# Patient Record
Sex: Female | Born: 1935 | Race: White | Hispanic: No | Smoking: Never smoker
Health system: Southern US, Community
[De-identification: ages and names within clinical notes are randomized; demographics above are authoritative.]

## PROBLEM LIST (undated history)

## (undated) DIAGNOSIS — G20A1 Parkinson's disease without dyskinesia, without mention of fluctuations: Secondary | ICD-10-CM

## (undated) DIAGNOSIS — R251 Tremor, unspecified: Secondary | ICD-10-CM

## (undated) DIAGNOSIS — M8580 Other specified disorders of bone density and structure, unspecified site: Secondary | ICD-10-CM

## (undated) DIAGNOSIS — I10 Essential (primary) hypertension: Secondary | ICD-10-CM

## (undated) DIAGNOSIS — G20 Parkinson's disease: Secondary | ICD-10-CM

## (undated) DIAGNOSIS — R413 Other amnesia: Secondary | ICD-10-CM

## (undated) DIAGNOSIS — K219 Gastro-esophageal reflux disease without esophagitis: Secondary | ICD-10-CM

## (undated) HISTORY — DX: Tremor, unspecified: R25.1

## (undated) HISTORY — PX: EYE SURGERY: SHX253

## (undated) HISTORY — DX: Other amnesia: R41.3

## (undated) HISTORY — DX: Other specified disorders of bone density and structure, unspecified site: M85.80

## (undated) HISTORY — DX: Gastro-esophageal reflux disease without esophagitis: K21.9

## (undated) HISTORY — DX: Essential (primary) hypertension: I10

## (undated) HISTORY — PX: TONSILLECTOMY: SUR1361

## (undated) HISTORY — PX: BREAST SURGERY: SHX581

---

## 1998-04-21 ENCOUNTER — Other Ambulatory Visit: Admission: RE | Admit: 1998-04-21 | Discharge: 1998-04-21 | Payer: Self-pay | Admitting: *Deleted

## 1999-07-25 ENCOUNTER — Encounter (INDEPENDENT_AMBULATORY_CARE_PROVIDER_SITE_OTHER): Payer: Self-pay | Admitting: *Deleted

## 1999-07-25 ENCOUNTER — Ambulatory Visit (HOSPITAL_COMMUNITY): Admission: RE | Admit: 1999-07-25 | Discharge: 1999-07-25 | Payer: Self-pay | Admitting: Gastroenterology

## 2000-03-13 ENCOUNTER — Encounter: Payer: Self-pay | Admitting: Ophthalmology

## 2000-03-13 ENCOUNTER — Inpatient Hospital Stay (HOSPITAL_COMMUNITY): Admission: EM | Admit: 2000-03-13 | Discharge: 2000-03-14 | Payer: Self-pay | Admitting: Emergency Medicine

## 2000-05-06 ENCOUNTER — Encounter: Payer: Self-pay | Admitting: *Deleted

## 2000-05-06 ENCOUNTER — Encounter: Admission: RE | Admit: 2000-05-06 | Discharge: 2000-05-06 | Payer: Self-pay | Admitting: *Deleted

## 2000-06-07 ENCOUNTER — Encounter: Payer: Self-pay | Admitting: *Deleted

## 2000-06-07 ENCOUNTER — Encounter: Admission: RE | Admit: 2000-06-07 | Discharge: 2000-06-07 | Payer: Self-pay | Admitting: *Deleted

## 2001-06-09 ENCOUNTER — Encounter: Payer: Self-pay | Admitting: Internal Medicine

## 2001-06-09 ENCOUNTER — Encounter: Admission: RE | Admit: 2001-06-09 | Discharge: 2001-06-09 | Payer: Self-pay | Admitting: Internal Medicine

## 2002-06-18 ENCOUNTER — Encounter: Admission: RE | Admit: 2002-06-18 | Discharge: 2002-06-18 | Payer: Self-pay | Admitting: Internal Medicine

## 2002-06-18 ENCOUNTER — Encounter: Payer: Self-pay | Admitting: Internal Medicine

## 2004-02-18 ENCOUNTER — Encounter: Admission: RE | Admit: 2004-02-18 | Discharge: 2004-02-18 | Payer: Self-pay | Admitting: Internal Medicine

## 2004-04-10 ENCOUNTER — Ambulatory Visit (HOSPITAL_COMMUNITY): Admission: RE | Admit: 2004-04-10 | Discharge: 2004-04-10 | Payer: Self-pay | Admitting: Gastroenterology

## 2004-04-10 ENCOUNTER — Encounter (INDEPENDENT_AMBULATORY_CARE_PROVIDER_SITE_OTHER): Payer: Self-pay | Admitting: *Deleted

## 2005-03-15 ENCOUNTER — Encounter: Admission: RE | Admit: 2005-03-15 | Discharge: 2005-03-15 | Payer: Self-pay | Admitting: Internal Medicine

## 2005-10-05 ENCOUNTER — Encounter: Admission: RE | Admit: 2005-10-05 | Discharge: 2005-10-05 | Payer: Self-pay | Admitting: Internal Medicine

## 2006-03-15 ENCOUNTER — Encounter: Admission: RE | Admit: 2006-03-15 | Discharge: 2006-03-15 | Payer: Self-pay | Admitting: Internal Medicine

## 2006-04-09 ENCOUNTER — Encounter: Admission: RE | Admit: 2006-04-09 | Discharge: 2006-04-09 | Payer: Self-pay | Admitting: Internal Medicine

## 2007-04-11 ENCOUNTER — Encounter: Admission: RE | Admit: 2007-04-11 | Discharge: 2007-04-11 | Payer: Self-pay | Admitting: Internal Medicine

## 2007-12-03 ENCOUNTER — Ambulatory Visit: Payer: Self-pay | Admitting: Vascular Surgery

## 2008-04-12 ENCOUNTER — Encounter: Admission: RE | Admit: 2008-04-12 | Discharge: 2008-04-12 | Payer: Self-pay | Admitting: Internal Medicine

## 2009-04-25 ENCOUNTER — Encounter: Admission: RE | Admit: 2009-04-25 | Discharge: 2009-04-25 | Payer: Self-pay | Admitting: Internal Medicine

## 2010-05-24 ENCOUNTER — Encounter: Admission: RE | Admit: 2010-05-24 | Discharge: 2010-05-24 | Payer: Self-pay | Admitting: Internal Medicine

## 2011-01-23 NOTE — Consult Note (Signed)
NEW PATIENT CONSULTATION   Karla Phillips, Karla Phillips  DOB:  1922-06-20                                       12/03/2007  ZOXWR#:[id number]   The patient presents today for evaluation of left leg pain.  She is a  very pleasant 77 year old white female with left leg pain.  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  She does not have any significant  swelling.  Does not have any history of deep vein thrombosis.  She does  take Aleve for discomfort, mostly arthritic pain, and elevates her legs  when possible.  She reports an episode approximately 3 weeks ago when  she had total left leg and also lower back discomfort, which made it  difficult for her to walk.  This is better than it was that particular  Sunday, but is not back to its normal baseline.   PAST MEDICAL HISTORY:  Negative for hypertension or diabetes.   SURGICAL HISTORY:  Significant for tonsillectomy and cataracts.  She  does have prior abdominal surgery and she is unclear as to what was  done.   SOCIAL HISTORY:  She is widowed.  She does not smoke, or drink alcohol.   REVIEW OF SYSTEMS:  Positive for chest tightness and pressure, reflux,  urinary frequency, dizziness, joint pain, and nervousness.   She has no known drug allergies.   PHYSICAL EXAM:  Well-developed, well-nourished white female appearing  stated age of 17.  Blood pressure is 128/80, pulse 68, respirations 18.  Her radial pulses are 2+.  She has 2 to 3+ dorsalis pedis pulses  bilaterally.  She does have a few spider vein telangiectasia in both  lower extremities.  She does not have any swelling.  She does have some  tributary varicosities in her posterior popliteal fossa.  She underwent  a screening duplex by me and this revealed no gross reflux in her great  or small saphenous vein on the left with no enlargement.  I discussed  this with the patient.  I feel that she  most likely has low back or  primary left hip issues causing the discomfort that she has experienced  over the past 3 weeks.  She does not have any significant venous  hypertension and I do not feel she has any significant pain associated  with her small varicosities.  She was concerned regarding the issue of  this causing any more serious complication, and I have reassured her  that this should not put her at any increased risk of deep vein  thrombosis or other serious medical complications.  She is reassured  with this discussion and will see [place] on an as needed basis.   Larina Earthly, M.D.  Electronically Signed   TFE/MEDQ  D:  12/03/2007  T:  12/04/2007  Job:  [id number]   cc:   Georgann Housekeeper, MD

## 2011-01-26 NOTE — Discharge Summary (Signed)
[place]  Patient:    Karla Phillips, Karla Phillips                      MRN: [id number] Adm. Date:  03/13/00 Disc. Date: 03/14/00 Attending:  Guadelupe Sabin, M.D.                           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  HOSPITAL COURSE: (See detailed admission history and physical) The patient was evaluated preoperatively and felt to be in satisfactory condition for the proposed surgery under general anesthesia. The patient, therefore, was taken into the operating room where a scleral buckling procedure was performed using solid silicone implants #277 and #240, cryo application and diathermy application and external drainage of subretinal fluid with intervitreal air injection. The patient tolerated the 1 1/2 hour procedure well and was taken to the recovery room and subsequently to the 23-hour observation unit. The patient was seen on the morning following surgery. At that time, the retina appeared to be settling in place and the meridional fold had flattened at the 12 oclock position with good positioning of the intervitreal air bubble. It was felt that the patient could accomplish the same positioning at home and she was, therefore, discharged home to be followed in the office in 24 hours. The patient was given a printed list of discharge instruction on the care and use of the operated eye.  DISCHARGE MEDICATIONS: Tobradex and Cyclomydril ophthalmic solution 1 drop four times daily, thiamine, Maxitrol and Atropine ointment at bedtime. The patient is to use Tylenol as needed for pain.  CONDITION ON DISCHARGE: Improved.  DISCHARGE DIAGNOSIS: Rhegmatogenous retinal detachment left eye, single defect, pseudophakia both eyes. DD:  03/14/00 TD:  03/14/00 Job: [id number]

## 2011-01-26 NOTE — Op Note (Signed)
[place]  Patient:    Karla Phillips, Karla Phillips                      MRN: [id number] Proc. Date: 03/13/00 Attending:  Guadelupe Sabin, M.D. CC:         Marcelyn Bruins. Nile Riggs, M.D.             Lum Babe, M.D.                           Operative Report  PREOPERATIVE DIAGNOSIS: Rhegmatogenous retinal detachment, left eye, single defect.  POSTOPERATIVE DIAGNOSIS: Rhegmatogenous retinal detachment, left eye, single defect.  OPERATION/PROCEDURE:  Scleral buckling procedure, left eye, using solid silicon implants #277 and 240, cryoapplication, diathermy application, external drainage of subretinal fluid, intravitreal air injection.  ANESTHESIA: General.  SURGEON: Guadelupe Sabin, M.D.  ASSISTANT:  Nurse.  OPERATIVE PROCEDURE:  After the patient was prepped and draped, lid traction sutures were placed in the left upper and lower lids. A peritomy was performed adjacent to the limbus 360 degrees. The subconjunctival tissue was cleaned and the rectus muscles isolated with 4-0 silk traction sutures. The superior rectus muscle which was located directly over the retinal tear was then detached from its anatomic insertion and a 4-0 chromic cat gut suture placed in its tendon centrally and then whipped through each lateral portion of the muscle. The 4-0 silk traction suture was then attached to the stump of the insertion of the muscle. The subconjunctival tissue was cleaned, revealing that the sclera was extremely thin with scleral dehiscence at the 10 oclock and 2 oclock position. The sclera at the 12 oclock position was slightly thicker and was felt to be suitable for lamellar scleral dissection. Indirect ophthalmoscopy was performed and the retinal tear felt to be at the 12 oclock position was treated adjacent to the vitreous base was treated with direct cryo application. It was then elected to perform careful lamellar scleral dissection from the 1:30 to  10:30 position, the bed measuring approximately 10 mm in width. The bed was extremely thin. A total of two 4-0 green Mersilene sutures were closed to close the scleral flaps gently over a trimmed #277 solid silicone implant. A #240 solid silicone encircling band was placed about the globe, tied with two sutures at the 5 oclock position. Anchoring sutures of 5-0 white Dacron were placed at the 4:30 and 8:00 oclock position to hold the encircling band in place. After repeat indirect ophthalmoscopy, it was elected to drain fluid in the bed at the 12 oclock position. Incision was made through the inner scleral lamella. The choroid exposed and treated with light diathermy and then perforated with the pin electrode. An abundant amount of clear viscid subretinal fluid drained and continued draining. The scleral flaps were pulled up and the tension of the encircling band was adjusted. Total drainage of three times was performed. Inspection of the fundus revealed flattening of the retina with a meridional fold at the 12 oclock position. The eye was rather hypotonus following the drainage of subretinal fluid and it was elected to inject through the pyrous plana approximately 0.5 cc of sterile filtered air with a 30 gauge needle to fill the vitreous cavity and to act as a tamponade at the retinal break at the 12 oclock position. This was performed without complication. This improved the fundus status, although the meridional fold appeared to be still present, but smaller in  size. It was, therefore, felt that with positioning, a small amount of residual subretinal fluid would hopefully absorb spontaneously. It was, therefore, elected to close. Tenon capsule was pulled forward in the four quadrants and tied as a separate layer with a 6-0 chromic catgut suture. Neosporin ophthalmic solution was irrigated in the subtenon space. The conjunctiva was then pulled forward and closed with a running 6-0 chromic  catgut suture. Garamycin and Celestone were injected in the subtenon space inferiorly. Maxitrol and Atropine ointment were instilled in the conjunctivae cul-de-sac. A light patch and protective shield were applied to the operated eye. Duration of the procedure, and anesthesia administration was 1 1/2 hours. The patient tolerated the procedure well and general left the operating room for the recovery room and subsequently to the 23 hour observation unit. The patient was carefully placed on her back with her head elevated for positioning of the intraocular air bubble. DD:  03/14/00 TD:  03/14/00 Job: [id number]

## 2011-01-26 NOTE — Op Note (Signed)
NAME:  Karla Phillips, Karla Phillips                       ACCOUNT NO.:  [id number]   MEDICAL RECORD NO.:  [id number]                   PATIENT TYPE:  AMB   LOCATION:  ENDO                                 FACILITY:  [facility]   PHYSICIAN:  Danise Edge, M.D.                DATE OF BIRTH:  June 10, 1922   DATE OF PROCEDURE:  04/10/2004  DATE OF DISCHARGE:                                 OPERATIVE REPORT   REFERRING PHYSICIAN:  Georgann Housekeeper, M.D.   PROCEDURAL INDICATIONS:  Karla Phillips is a 77 year old female born  [birth year].  Karla Phillips is scheduled to undergo her first screening  colonoscopy with polypectomy to prevent colon cancer.   ENDOSCOPIST:  Danise Edge, M.D.   PREMEDICATION:  Versed 5 mg, Demerol 50 mg.   PROCEDURE:  After obtaining informed consent, Karla Phillips was placed in the  left lateral decubitus position.  I administered intravenous Demerol and  intravenous Versed to achieve conscious sedation for the procedure.  The  patient's blood pressure, oxygen saturation, and cardiac rhythm were  monitored throughout the procedure and documented in the medical record.   Anal inspection and digital rectal exam was normal.  The Olympus adjustable  pediatric colonoscope was introduced into the rectum and advanced to the  cecum.  Colonic preparation for the exam today was excellent.   RECTUM:  Normal.   SIGMOID COLON/DESCENDING COLON:  At approximately 50 cm from the anal verge,  a 1 mm sessile polyp was removed with electrocautery snare.   SPLENIC FLEXURE:  Normal.   TRANSVERSE COLON:  Normal.   HEPATIC FLEXURE:  Normal.   ASCENDING COLON:  From the mid ascending colon, a 2 mm sessile polyp was  removed with an electrocautery snare.   CECUM AND ILEOCECAL VALVE:  Normal.   ASSESSMENT:  A small polyp was removed from the mid ascending colon and a  diminutive polyp was removed from the proximal sigmoid colon.  Both polyps  were submitted in one bottle for pathological  evaluation.                                               Danise Edge, M.D.    MJ/MEDQ  D:  04/10/2004  T:  04/10/2004  Job:  [id number]   cc:   Georgann Housekeeper, M.D.  [address]  [place]  [place] [address]  Fax: [phone]

## 2012-03-26 ENCOUNTER — Other Ambulatory Visit: Payer: Self-pay | Admitting: Internal Medicine

## 2012-03-31 ENCOUNTER — Ambulatory Visit
Admission: RE | Admit: 2012-03-31 | Discharge: 2012-03-31 | Disposition: A | Discharge status: Home / Self Care | Payer: Medicare Other | Source: Ambulatory Visit | Attending: Internal Medicine | Admitting: Internal Medicine

## 2012-12-10 ENCOUNTER — Other Ambulatory Visit: Payer: Self-pay | Admitting: Diagnostic Neuroimaging

## 2013-04-06 ENCOUNTER — Other Ambulatory Visit: Payer: Self-pay

## 2013-04-06 MED ORDER — CARBIDOPA-LEVODOPA 25-100 MG PO TABS: 1.0000 | ORAL_TABLET | Freq: Three times a day (TID) | ORAL | Status: DC

## 2013-04-06 NOTE — Telephone Encounter (Signed)
Per Pharmacy, patient requests 90 day Rx

## 2013-04-20 ENCOUNTER — Encounter: Payer: Self-pay | Admitting: Diagnostic Neuroimaging

## 2013-04-20 ENCOUNTER — Ambulatory Visit (INDEPENDENT_AMBULATORY_CARE_PROVIDER_SITE_OTHER): Payer: Medicare Other | Admitting: Diagnostic Neuroimaging

## 2013-04-20 VITALS — BP 134/82 | HR 79 | Ht 61.0 in | Wt 121.0 lb

## 2013-04-20 DIAGNOSIS — G20 Parkinson's disease: Secondary | ICD-10-CM

## 2013-04-20 DIAGNOSIS — G20A1 Parkinson's disease without dyskinesia, without mention of fluctuations: Secondary | ICD-10-CM

## 2013-04-20 NOTE — Progress Notes (Signed)
[facility]  PATIENT: Karla Phillips DOB: November 22, 1921  HISTORY FROM: patient, son REASON FOR VISIT: follow up   HISTORICAL  CHIEF COMPLAINT:  Chief Complaint  Patient presents with  . Follow-up    yearly Per patient    HISTORY OF PRESENT ILLNESS:   UPDATE 04/20/13: Patient was seen in Feb 2014, comes in today on referral by Dr. Eula Listen.  Patient states she is having dizziness, feels like she is going to fall over, like "my head is full of air."   Taking Sinemet 3 times a day before meals, 8am, 12-1pm, and 8-9pm.  Soft, tremulous voice. Son helps with medication administration.  Using a 4 point cane, has a rolling walker at home, but doesn't use much.  UPDATE 10/14/12: No new events. Not having a good day (decr sleep last night). Tremor and memory are stable. Son or grandson stay with her at night.   UPDATE 06/13/12:  Her and her son feel that she has improved with her tremors.  Tolerating carbidopa-levodopa well without worsening dizziness or nause.  Her voice is soft.  Denies any falls.  She uses a 4 pronged cane.  she lives alone however her son lives behind her.  She is in the process of getting a life alert.    UPDATE 04/14/12: More tremor, poor handwriting, hoarse, quiet voice. No falls. Memory stable. Off donepezil.   UPDATE 01/25/11: Doing about the same re: tremor and memory complaints.  On donepezil without side effects. Does c/o "dizziness" intermittently and feeling "shaky" at times. Dizziness is more "lightheadedness" and feeling like she may pass out rather than vertigo. Pt has hx of anxiety. Gave up driving 2 months ago. No new complaints.  DATABASE:  Followed since October 2009 for tremor in her hands which bothers her writing, and gives her some difficulty holding a fork or spoon although she is able to feed herself. Her father and sister had a tremor in older age as well. Also short-term memory problems. For example, she'll forget conversations or things  she was just told. However, she continues to live alone, with her son in a trailer immediately behind her. She pays her own bills, manages her own finances, and looks after her own medications. She continues to drive to familiar places, and is not getting lost. She also does most of her own cooking. She says her moods are okay, and she denies symptoms of depression. She denies changes in appetite. She describes her sleep as "fair", does report naps during the day. Initial MMSE 26, AFT 8. Workup showed borderline B12, nl TSH, mild atrophy on MRI. Rx'd Ativan prn for tremor.  REVIEW OF SYSTEMS: Full 14 system review of systems performed and notable only for Trouble swallowing constipation joint pain weakness dizziness restless leg sleepiness.  ALLERGIES: Not on File  HOME MEDICATIONS: Prior to Admission medications   Medication Sig Start Date End Date Taking? Authorizing Provider  Calcium Carbonate-Vitamin D (CALCIUM-VITAMIN D) 500-200 MG-UNIT per tablet Take 1 tablet by mouth daily.   Yes Historical Provider, MD  carbidopa-levodopa (SINEMET IR) 25-100 MG per tablet Take 1 tablet by mouth 3 (three) times daily. 30 minutes before meals 04/06/13  Yes Suanne Marker, MD  hydrochlorothiazide (HYDRODIURIL) 25 MG tablet  04/03/13  Yes Historical Provider, MD  LORazepam (ATIVAN) 0.5 MG tablet Take 0.5 mg by mouth at bedtime as needed for anxiety.   Yes Historical Provider, MD  meclizine (ANTIVERT) 12.5 MG tablet Take 12.5 mg by mouth daily. PRN  Yes Historical Provider, MD  ranitidine (ZANTAC) 150 MG tablet  04/03/13  Yes Historical Provider, MD  vitamin B-12 (CYANOCOBALAMIN) 1000 MCG tablet Take 1,000 mcg by mouth daily.   Yes Historical Provider, MD   Outpatient Prescriptions Prior to Visit  Medication Sig Dispense Refill  . carbidopa-levodopa (SINEMET IR) 25-100 MG per tablet Take 1 tablet by mouth 3 (three) times daily. 30 minutes before meals  270 tablet  2   No facility-administered medications  prior to visit.    PAST MEDICAL HISTORY: Past Medical History  Diagnosis Date  . Hypertension   . Osteopenia   . GERD (gastroesophageal reflux disease)   . Memory loss   . Tremor     PAST SURGICAL HISTORY: Past Surgical History  Procedure Laterality Date  . Eye surgery    . Breast surgery    . Tonsillectomy      FAMILY HISTORY: No family history on file.  SOCIAL HISTORY:  History   Social History  . Marital Status: Widowed    Spouse Name: N/A    Number of Children: N/A  . Years of Education: N/A   Occupational History  . Not on file.   Social History Main Topics  . Smoking status: Never Smoker   . Smokeless tobacco: Not on file  . Alcohol Use: Not on file  . Drug Use: Not on file  . Sexually Active: Not on file   Other Topics Concern  . Not on file   Social History Narrative   She is retired, used to do office work. She has a high school education. She is widowed and lives alone, but her son lives immediately behind her. She denies tobacco, alcohol, illicit drug use.     PHYSICAL EXAM  Filed Vitals:   04/20/13 0952 04/20/13 0953  BP: 126/64 134/82  Pulse: 74 79  Height: 5\' 1"  (1.549 m)   Weight: 121 lb (54.885 kg)     Not recorded    Body mass index is 22.87 kg/(m^2).  GENERAL EXAM: General: Patient is awake, alert and in no acute distress.  Well developed and groomed.  MASKED FACIES. FLAT AFFECT Cardiovascular: No carotid artery bruits.  Heart is regular rate and rhythm with no murmurs. Musculoskeletal: MILD KYPHOSIS  Neurologic Exam  Mental Status: Awake, alert.  Language is fluent and comprehension intact. SOFT VOICE. POSITIVE SNOUT REFLEX. Cranial Nerves: Pupils are equal and reactive to light.  Visual fields are full to confrontation.  Conjugate eye movements are full and symmetric.  Facial sensation and strength are symmetric.  Hearing is intact.  Palate elevated symmetrically and uvula is midline.  Shoulder shrug is symmetric.  Tongue is  midline. Motor: RESTING TREMOR BUE (RUE > LUE). MILD POSTURAL TREMOR, MOD-SEVERE BRADYKINESIA IN BUE AND BLE. COGWHEELING IN BUE. Normal bulk and tone.  BUE/BLE 4/5.  Sensory: Intact and symmetric to light touch. Coordination: NO DYSMETRIA. Gait and Station: STOOPED POSTURE. BARELY ABLE TO STAND AND TAKE STEPS. NOT USING 4 PRONG CANE WELL. ATAXIA WITH TURNING.    DIAGNOSTIC DATA (LABS, IMAGING, TESTING) - I reviewed patient records, labs, notes, testing and imaging myself where available.  No results found for this basename: WBC,  HGB,  HCT,  MCV,  PLT   No results found for this basename: na,  k,  cl,  co2,  glucose,  bun,  creatinine,  calcium,  prot,  albumin,  ast,  alt,  alkphos,  bilitot,  gfrnonaa,  gfraa   No results found for this  basename: CHOL,  HDL,  LDLCALC,  LDLDIRECT,  TRIG,  CHOLHDL   No results found for this basename: HGBA1C   No results found for this basename: VITAMINB12   No results found for this basename: TSH      ASSESSMENT AND PLAN  79 y.o. right handed, female with parkinson's disease and MCI. Still lives alone, but has good family support (son, grandson). High fall risk.  Dx: parkinson's disease + mild cognitive impairment  PLAN: 1. Increase carb/levo 25/100 up to 1 tab fours times per day or 1.5tabs three times per day 2. home health eval for fall safety and PT 3. Would benefit from rollator walker  Return in about 1 year (around 04/20/2014) for with Heide Guile or Cythia Bachtel.    Suanne Marker, MD 04/20/2013, 10:37 AM Certified in Neurology, Neurophysiology and Neuroimaging  [facility] [address], [address] [place], [place] [address] [phone]

## 2013-04-20 NOTE — Patient Instructions (Addendum)
Recommend taking last dose of Sinemet around 5pm instead of 8-9 pm.  Can increase Sinemet dose to: Up to 1 tablet 4 times a day  Or take 1.5 tablets 3 times a day.  Goal is to get better tremor control or better movement while walking.  Follow up in 1 year.

## 2014-01-20 ENCOUNTER — Other Ambulatory Visit: Payer: Self-pay | Admitting: Diagnostic Neuroimaging

## 2014-01-20 NOTE — Telephone Encounter (Signed)
PLAN:  1. Increase carb/levo 25/100 up to 1 tab fours times per day or 1.5tabs three times per day

## 2014-03-12 ENCOUNTER — Encounter (HOSPITAL_COMMUNITY): Payer: Self-pay | Admitting: Emergency Medicine

## 2014-03-12 ENCOUNTER — Emergency Department (HOSPITAL_COMMUNITY): Payer: Medicare Other

## 2014-03-12 ENCOUNTER — Emergency Department (HOSPITAL_COMMUNITY)
Admission: EM | Admit: 2014-03-12 | Discharge: 2014-03-13 | Disposition: A | Discharge status: Home / Self Care | Payer: Medicare Other | Attending: Emergency Medicine | Admitting: Emergency Medicine

## 2014-03-12 DIAGNOSIS — M899 Disorder of bone, unspecified: Secondary | ICD-10-CM | POA: Insufficient documentation

## 2014-03-12 DIAGNOSIS — R5383 Other fatigue: Secondary | ICD-10-CM | POA: Diagnosis not present

## 2014-03-12 DIAGNOSIS — K219 Gastro-esophageal reflux disease without esophagitis: Secondary | ICD-10-CM | POA: Diagnosis not present

## 2014-03-12 DIAGNOSIS — Y929 Unspecified place or not applicable: Secondary | ICD-10-CM | POA: Diagnosis not present

## 2014-03-12 DIAGNOSIS — R413 Other amnesia: Secondary | ICD-10-CM | POA: Diagnosis not present

## 2014-03-12 DIAGNOSIS — G20 Parkinson's disease: Secondary | ICD-10-CM | POA: Diagnosis not present

## 2014-03-12 DIAGNOSIS — R51 Headache: Secondary | ICD-10-CM | POA: Diagnosis not present

## 2014-03-12 DIAGNOSIS — W19XXXA Unspecified fall, initial encounter: Secondary | ICD-10-CM | POA: Insufficient documentation

## 2014-03-12 DIAGNOSIS — Y939 Activity, unspecified: Secondary | ICD-10-CM | POA: Diagnosis not present

## 2014-03-12 DIAGNOSIS — M6281 Muscle weakness (generalized): Secondary | ICD-10-CM | POA: Diagnosis not present

## 2014-03-12 DIAGNOSIS — R259 Unspecified abnormal involuntary movements: Secondary | ICD-10-CM | POA: Insufficient documentation

## 2014-03-12 DIAGNOSIS — S0990XA Unspecified injury of head, initial encounter: Secondary | ICD-10-CM | POA: Diagnosis present

## 2014-03-12 DIAGNOSIS — G20A1 Parkinson's disease without dyskinesia, without mention of fluctuations: Secondary | ICD-10-CM | POA: Insufficient documentation

## 2014-03-12 DIAGNOSIS — I10 Essential (primary) hypertension: Secondary | ICD-10-CM | POA: Insufficient documentation

## 2014-03-12 DIAGNOSIS — R5381 Other malaise: Secondary | ICD-10-CM | POA: Diagnosis not present

## 2014-03-12 DIAGNOSIS — Z79899 Other long term (current) drug therapy: Secondary | ICD-10-CM | POA: Diagnosis not present

## 2014-03-12 DIAGNOSIS — M949 Disorder of cartilage, unspecified: Secondary | ICD-10-CM

## 2014-03-12 HISTORY — DX: Parkinson's disease without dyskinesia, without mention of fluctuations: G20.A1

## 2014-03-12 HISTORY — DX: Parkinson's disease: G20

## 2014-03-12 LAB — BASIC METABOLIC PANEL
Anion gap: 12 (ref 5–15)
BUN: 31 mg/dL — ABNORMAL HIGH (ref 6–23)
CO2: 29 mEq/L (ref 19–32)
Calcium: 10 mg/dL (ref 8.4–10.5)
Chloride: 100 mEq/L (ref 96–112)
Creatinine, Ser: 0.69 mg/dL (ref 0.50–1.10)
GFR calc non Af Amer: 75 mL/min — ABNORMAL LOW (ref >90)
GFR, EST AFRICAN AMERICAN: 87 mL/min — AB (ref >90)
Glucose, Bld: 119 mg/dL — ABNORMAL HIGH (ref 70–99)
Potassium: 3.8 mEq/L (ref 3.7–5.3)
Sodium: 141 mEq/L (ref 137–147)

## 2014-03-12 LAB — CBC
HCT: 37.5 % (ref 36.0–46.0)
Hemoglobin: 12 g/dL (ref 12.0–15.0)
MCH: 30.5 pg (ref 26.0–34.0)
MCHC: 32 g/dL (ref 30.0–36.0)
MCV: 95.4 fL (ref 78.0–100.0)
Platelets: 165 10*3/uL (ref 150–400)
RBC: 3.93 MIL/uL (ref 3.87–5.11)
RDW: 12.7 % (ref 11.5–15.5)
WBC: 6.5 10*3/uL (ref 4.0–10.5)

## 2014-03-12 LAB — PRO B NATRIURETIC PEPTIDE: PRO B NATRI PEPTIDE: 197.7 pg/mL (ref 0–450)

## 2014-03-12 MED ORDER — SODIUM CHLORIDE 0.9 % IV BOLUS (SEPSIS)
500.0000 mL | Freq: Once | INTRAVENOUS | Status: AC
  Administered 2014-03-13: 500 mL via INTRAVENOUS

## 2014-03-12 MED ORDER — SODIUM CHLORIDE 0.9 % IV BOLUS (SEPSIS)
500.0000 mL | Freq: Once | INTRAVENOUS | Status: AC
  Administered 2014-03-12: 500 mL via INTRAVENOUS

## 2014-03-12 NOTE — ED Notes (Signed)
Patient is from home, fell to floor after getting dizzy.  Patient has had vertigo in the past.  Patient does have some left knee pain after the fall and chronic back pain.  No deformity noted to left knee.  No LOC.  Patient did not hit her head.  Patient does have Parkinson's.

## 2014-03-12 NOTE — ED Notes (Signed)
Pt unsure why she is here. States that she got dizzy, but denies any complaint at this time. Per son, he found pt laying on the floor. States he had seen pt appx 5 minutes prior. Son states that pt was AO at baseline but said "I just got dizzy and fell." Pt denies any pain. No obvious injury. Neuro intact. VSS.

## 2014-03-12 NOTE — ED Notes (Signed)
Dr. Glick at bedside.  

## 2014-03-13 LAB — URINALYSIS, ROUTINE W REFLEX MICROSCOPIC
Bilirubin Urine: NEGATIVE
Glucose, UA: NEGATIVE mg/dL
HGB URINE DIPSTICK: NEGATIVE
KETONES UR: 15 mg/dL — AB
NITRITE: NEGATIVE
PH: 6.5 (ref 5.0–8.0)
PROTEIN: NEGATIVE mg/dL
Specific Gravity, Urine: 1.024 (ref 1.005–1.030)
UROBILINOGEN UA: 1 mg/dL (ref 0.0–1.0)

## 2014-03-13 LAB — URINE MICROSCOPIC-ADD ON

## 2014-03-13 NOTE — Progress Notes (Addendum)
ED CM received call from Flow Manager regarding patient that was seen in ED over night needing HH Services. Reviewed record and confirmed orders, and insurance coverage. Contacted patient and family by phone regarding [facility]. Pt and family  [facility], PT,OT, SW and HHA. Discussed the services available and covered by patient's insurance. Family verbalized understanding and appreciation for the assistance. Offered choice, read list of HH agencies over the phone. Selected [facility] as choice. Verified address and phone number with family.  [facility].  Expl[facility] to family that [facility] at the number verified regarding the assessment visit. Verbalized understanding teach back done. Provided patient and family with my contact information if any further questions or concerns should arise. No further ED CM needs identified.

## 2014-03-13 NOTE — ED Provider Notes (Signed)
I saw and evaluated the patient, reviewed the resident's note and I agree with the findings and plan.  I spoke with the patient and the patient's family at length.  Patient's family is agreeable to taking her home and following up closely with her primary care physician.  They're agreeable to receive home health services including home RN, PT, OT, social work, Engineer, productionaide.  I assisted the resident in placing the home health orders in face-to-face documentation necessary for home health services.  The patient will be contacted by social work at home.  The patient and family have also been informed as to contact numbers if they do not hear from our social worker/case management team.  Patient's son understands to return to the ER for new or seen symptoms.  Close PCP followup.    EKG Interpretation   Date/Time:  Friday March 12 2014 20:13:47 EDT Ventricular Rate:  78 PR Interval:  169 QRS Duration: 137 QT Interval:  432 QTC Calculation: 492 R Axis:   -81 Text Interpretation:  Sinus rhythm Multiform ventricular premature  complexes Left bundle branch block Artifact in lead(s) I III aVR aVL When  compared with ECG of 03/13/2000, Left bundle branch block is now Present  Premature ventricular complexes are now Present Confirmed by [facility]  MD,  DAVID ([id number]) on 03/12/2014 8:18:54 PM        Lyanne CoKevin M Keaundre Thelin, MD 03/13/14 0222

## 2014-03-13 NOTE — ED Provider Notes (Signed)
CSN: [id number]     Arrival date & time 03/12/14  2007 History   First MD Initiated Contact with Patient 03/12/14 2115     Chief Complaint  Patient presents with  . Fall  . Fatigue   80 y/o female with past medical history of parkinsons disease that presents after a ground level fall. Son states that he found the patient down but that she was conscious. She states that she fell due to balance but that she is unsure of LOC, currently she does have a headache. She has generalized weakness that has been present for the past day, son states that she is usually more active but that she has been declining lately. Patient deneis fever, cough/congestion, SOB.chest pain or urinary symptoms.   (Consider location/radiation/quality/duration/timing/severity/associated sxs/prior Treatment) Patient is a 80 y.o. female presenting with fall.  Fall This is a recurrent problem. The current episode started today. The problem occurs intermittently. The problem has been unchanged. Associated symptoms include headaches and weakness. Pertinent negatives include no abdominal pain, chest pain, congestion, coughing, nausea or vomiting. The symptoms are aggravated by walking. She has tried nothing for the symptoms. The treatment provided no relief.    Past Medical History  Diagnosis Date  . Hypertension   . Osteopenia   . GERD (gastroesophageal reflux disease)   . Memory loss   . Tremor   . Parkinson disease    Past Surgical History  Procedure Laterality Date  . Eye surgery    . Breast surgery    . Tonsillectomy     No family history on file. History  Substance Use Topics  . Smoking status: Never Smoker   . Smokeless tobacco: Not on file  . Alcohol Use: No   OB History   Grav Para Term Preterm Abortions TAB SAB Ect Mult Living                 Review of Systems  Constitutional: Negative for activity change.  HENT: Negative for congestion.   Respiratory: Negative for cough and shortness of breath.    Cardiovascular: Negative for chest pain and leg swelling.  Gastrointestinal: Negative for nausea, vomiting, abdominal pain, diarrhea, constipation, blood in stool and abdominal distention.  Genitourinary: Negative for dysuria, flank pain and vaginal discharge.  Musculoskeletal: Negative for back pain.  Skin: Negative for color change.  Neurological: Positive for weakness and headaches. Negative for syncope.  Psychiatric/Behavioral: Negative for agitation.      Allergies  Review of patient's allergies indicates no known allergies.  Home Medications   Prior to Admission medications   Medication Sig Start Date End Date Taking? Authorizing Provider  Calcium Carbonate-Vitamin D (CALCIUM-VITAMIN D) 500-200 MG-UNIT per tablet Take 1 tablet by mouth daily.   Yes Historical Provider, MD  carbidopa-levodopa (SINEMET IR) 25-100 MG per tablet Take 0.5 tablets by mouth 3 (three) times daily. 01/20/14  Yes Suanne MarkerVikram R Penumalli, MD  hydrochlorothiazide (HYDRODIURIL) 25 MG tablet Take 25 mg by mouth daily.  04/03/13  Yes Historical Provider, MD  LORazepam (ATIVAN) 0.5 MG tablet Take 0.5 mg by mouth at bedtime as needed for anxiety.   Yes Historical Provider, MD  meclizine (ANTIVERT) 12.5 MG tablet Take 12.5 mg by mouth daily as needed for dizziness. PRN   Yes Historical Provider, MD  ranitidine (ZANTAC) 150 MG tablet Take 150 mg by mouth daily.  04/03/13  Yes Historical Provider, MD  vitamin B-12 (CYANOCOBALAMIN) 1000 MCG tablet Take 1,000 mcg by mouth daily.   Yes Historical Provider,  MD   BP 115/74  Pulse 63  Temp(Src) 97.5 F (36.4 C) (Oral)  Resp 17  SpO2 97% Physical Exam  Nursing note and vitals reviewed. Constitutional: She is oriented to person, place, and time. She appears well-developed.  No visual signs of trauma on the patient, no noted bruising.   HENT:  Head: Normocephalic.  Eyes: Pupils are equal, round, and reactive to light.  Neck: Neck supple.  Cardiovascular: Normal rate.   Exam reveals no gallop and no friction rub.   No murmur heard. Pulmonary/Chest: Effort normal and breath sounds normal. No respiratory distress.  Abdominal: Soft. She exhibits no distension. There is no tenderness. There is no rebound.  Musculoskeletal: She exhibits no edema.  Neurological: She is alert and oriented to person, place, and time. She has normal reflexes. She displays tremor. No cranial nerve deficit or sensory deficit. She exhibits normal muscle tone. She displays no seizure activity. GCS eye subscore is 4. GCS verbal subscore is 5. GCS motor subscore is 6.  Patient has an unsteady gait  Generalized weakness that is not focal.  Cerebellar testing is intact   Skin: Skin is warm.  Psychiatric: She has a normal mood and affect.    ED Course  Procedures (including critical care time) Labs Review Labs Reviewed  BASIC METABOLIC PANEL - Abnormal; Notable for the following:    Glucose, Bld 119 (*)    BUN 31 (*)    GFR calc non Af Amer 75 (*)    GFR calc Af Amer 87 (*)    All other components within normal limits  URINALYSIS, ROUTINE W REFLEX MICROSCOPIC - Abnormal; Notable for the following:    APPearance CLOUDY (*)    Ketones, ur 15 (*)    Leukocytes, UA SMALL (*)    All other components within normal limits  URINE MICROSCOPIC-ADD ON - Abnormal; Notable for the following:    Bacteria, UA FEW (*)    All other components within normal limits  CBC  PRO B NATRIURETIC PEPTIDE    Imaging Review Dg Chest 2 View  03/12/2014   CLINICAL DATA:  Fall, fatigue.  EXAM: CHEST  2 VIEW  COMPARISON:  None.  FINDINGS: Heart is borderline in size. No confluent airspace opacities or effusions. Mild tortuosity and calcifications in the thoracic aorta. No acute bony abnormality or pneumothorax.  IMPRESSION: No active cardiopulmonary disease.   Electronically Signed   By: Charlett Nose M.D.   On: 03/12/2014 22:43   Ct Head Wo Contrast  03/12/2014   CLINICAL DATA:  Fall.  EXAM: CT HEAD WITHOUT  CONTRAST  CT CERVICAL SPINE WITHOUT CONTRAST  TECHNIQUE: Multidetector CT imaging of the head and cervical spine was performed following the standard protocol without intravenous contrast. Multiplanar CT image reconstructions of the cervical spine were also generated.  COMPARISON:  03/15/2006  FINDINGS: CT HEAD FINDINGS  There is atrophy and chronic small vessel disease changes. No acute intracranial abnormality. Specifically, no hemorrhage, hydrocephalus, mass lesion, acute infarction, or significant intracranial injury. No acute calvarial abnormality. Visualized paranasal sinuses and mastoids clear. Orbital soft tissues unremarkable.  CT CERVICAL SPINE FINDINGS  Mild degenerative disc disease, most pronounced at C5-6 and C6-7. Severe diffuse bilateral degenerative facet disease. Slight anterolisthesis of C4 on C5, C5 on C6 and C6 on C7 related to facet disease. Prevertebral soft tissues are normal. Diffuse osteopenia. No fracture. No epidural or paraspinal hematoma.  IMPRESSION: No acute intracranial abnormality.  No acute bony abnormality within the cervical spine.  Electronically Signed   By: Charlett NoseKevin  Dover M.D.   On: 03/12/2014 22:37   Ct Cervical Spine Wo Contrast  03/12/2014   CLINICAL DATA:  Fall.  EXAM: CT HEAD WITHOUT CONTRAST  CT CERVICAL SPINE WITHOUT CONTRAST  TECHNIQUE: Multidetector CT imaging of the head and cervical spine was performed following the standard protocol without intravenous contrast. Multiplanar CT image reconstructions of the cervical spine were also generated.  COMPARISON:  03/15/2006  FINDINGS: CT HEAD FINDINGS  There is atrophy and chronic small vessel disease changes. No acute intracranial abnormality. Specifically, no hemorrhage, hydrocephalus, mass lesion, acute infarction, or significant intracranial injury. No acute calvarial abnormality. Visualized paranasal sinuses and mastoids clear. Orbital soft tissues unremarkable.  CT CERVICAL SPINE FINDINGS  Mild degenerative disc  disease, most pronounced at C5-6 and C6-7. Severe diffuse bilateral degenerative facet disease. Slight anterolisthesis of C4 on C5, C5 on C6 and C6 on C7 related to facet disease. Prevertebral soft tissues are normal. Diffuse osteopenia. No fracture. No epidural or paraspinal hematoma.  IMPRESSION: No acute intracranial abnormality.  No acute bony abnormality within the cervical spine.   Electronically Signed   By: Charlett NoseKevin  Dover M.D.   On: 03/12/2014 22:37     EKG Interpretation   Date/Time:  Friday March 12 2014 20:13:47 EDT Ventricular Rate:  78 PR Interval:  169 QRS Duration: 137 QT Interval:  432 QTC Calculation: 492 R Axis:   -81 Text Interpretation:  Sinus rhythm Multiform ventricular premature  complexes Left bundle branch block Artifact in lead(s) I III aVR aVL When  compared with ECG of 03/13/2000, Left bundle branch block is now Present  Premature ventricular complexes are now Present Confirmed by [facility] LLCGL[facility]  MD,  DAVID ([id number]) on 03/12/2014 8:18:54 PM      MDM   Final diagnoses:  Other fatigue   80 y/o female with past medical history of parkinsons who presents after a ground level fall brought in by EMS. On physical exam the patient has generalized weakness but no signs of trauma.   On physical exam the patient is only able to transfer from bed to chair and is unable to take steps with assistance.   Patient evaluated with CT head, CT C spine and no acute injuries were identified. The patient's weakness was work-up with CBC, BMP, BNP, UA and chest X ray. No acute abnormality was identified to explain the patient's weakness.   The patient remained hemodynamically stable in the department   It was determined that the patient most likely is weak as a result to the declining condition of parkinson. A long discussion was had with family and it was determined that the patient would be discharged home and home health would be consulted to do a home assessment and to determine the best way  to help that family care for the patient at home. A this time the patient was discharged with instructions to the family that they are always welcome to return with any needs.     Clement SayresStaci Renad Jenniges, MD 03/13/14 2326

## 2014-03-13 NOTE — ED Notes (Signed)
Pt awaiting PTAR.  Son at bedside given number to ED and flow mgr office in case they do not hear back from care management regarding home health.

## 2014-03-13 NOTE — ED Provider Notes (Signed)
80 year old female who comes in after a fall today. Son states that she had fallen several weeks ago and again today. She seems to be somewhat weak. There is no obvious injury in the fall. M., she is noted to have masklike facies and bradykinesia. There is no obvious head injury neck is nontender. Lungs are clear and heart has regular rate and rhythm. Extremities show no evidence of trauma but there is significant increased muscle tone with cogwheel rigidity consistent with Parkinson's disease. I am concerned that her falls are mainly a result of advanced Parkinson's disease. She is being evaluated for occult infection. Head CT was unremarkable and urinalysis is pending.  I saw and evaluated the patient, reviewed the resident's note and I agree with the findings and plan.   EKG Interpretation   Date/Time:  Friday March 12 2014 20:13:47 EDT Ventricular Rate:  78 PR Interval:  169 QRS Duration: 137 QT Interval:  432 QTC Calculation: 492 R Axis:   -81 Text Interpretation:  Sinus rhythm Multiform ventricular premature  complexes Left bundle branch block Artifact in lead(s) I III aVR aVL When  compared with ECG of 03/13/2000, Left bundle branch block is now Present  Premature ventricular complexes are now Present Confirmed by [facility]  MD,  Jeylin Woodmansee ([id number]) on 03/12/2014 8:18:54 PM        Dione Boozeavid Daniella Dewberry, MD 03/13/14 [id number]

## 2014-04-08 ENCOUNTER — Ambulatory Visit: Payer: Medicare Other | Admitting: Diagnostic Neuroimaging

## 2014-04-12 ENCOUNTER — Ambulatory Visit (INDEPENDENT_AMBULATORY_CARE_PROVIDER_SITE_OTHER): Payer: Medicare Other | Admitting: Nurse Practitioner

## 2014-04-12 ENCOUNTER — Encounter: Payer: Self-pay | Admitting: Nurse Practitioner

## 2014-04-12 VITALS — BP 114/69 | HR 68 | Ht 60.0 in | Wt 114.0 lb

## 2014-04-12 DIAGNOSIS — G20 Parkinson's disease: Secondary | ICD-10-CM

## 2014-04-12 DIAGNOSIS — G20A1 Parkinson's disease without dyskinesia, without mention of fluctuations: Secondary | ICD-10-CM

## 2014-04-12 MED ORDER — CARBIDOPA-LEVODOPA 25-100 MG PO TABS: 1.0000 | ORAL_TABLET | Freq: Three times a day (TID) | ORAL | Status: AC

## 2014-04-12 NOTE — Progress Notes (Signed)
PATIENT: Karla Phillips DOB: 06-13-1922  REASON FOR VISIT: routine follow up for Parkinson's Disease HISTORY FROM: patient  HISTORY OF PRESENT ILLNESS: UPDATE 04/12/14 (LL): Patient comes in for yearly followup of Parkinson's disease. She had a fall at home approximately one month ago where her son found her on the ground but awake. She was taken to the hospital and get checked out, CT head and cervical spine were negative for anything acute. Son who accompanies her today states that she has been more weak in the last few months. After hospital discharge she was set up with home health physical therapy and occupational therapy and a nurse aide is coming to the house twice a week to help her bathe. Her son is still helping her with medicine administration and staying with her at night. She is no longer using the cane and using rolling walker at all times. She admits to having hallucinations of people and children in the house and once a dog, but she has not shared these hallucinations with family members. She is not upset by the hallucinations. She has continued to take only half a tablet of Sinemet 3 times a day. She has no complaints of nausea, trouble swallowing or constipation.  UPDATE 04/20/13 (VRP): Patient was seen in Feb 2014, comes in today on referral by Dr. Eula Phillips.  Patient states she is having dizziness, feels like she is going to fall over, like "my head is full of air."   Taking Sinemet 3 times a day before meals, 8am, 12-1pm, and 8-9pm.  Soft, tremulous voice. Son helps with medication administration.  Using a 4 point cane, has a rolling walker at home, but doesn't use much.  UPDATE 10/14/12: No new events. Not having a good day (decr sleep last night). Tremor and memory are stable. Son or grandson stay with her at night.   UPDATE 06/13/12:  Her and her son feel that she has improved with her tremors.  Tolerating carbidopa-levodopa well without worsening dizziness or nause.  Her voice is  soft.  Denies any falls.  She uses a 4 pronged cane.  she lives alone however her son lives behind her.  She is in the process of getting a life alert.    UPDATE 04/14/12: More tremor, poor handwriting, hoarse, quiet voice. No falls. Memory stable. Off donepezil.   UPDATE 01/25/11: Doing about the same re: tremor and memory complaints.  On donepezil without side effects. Does c/o "dizziness" intermittently and feeling "shaky" at times. Dizziness is more "lightheadedness" and feeling like she may pass out rather than vertigo. Pt has hx of anxiety. Gave up driving 2 months ago. No new complaints.  DATABASE:  Followed since October 2009 for tremor in her hands which bothers her writing, and gives her some difficulty holding a fork or spoon although she is able to feed herself. Her father and sister had a tremor in older age as well. Also short-term memory problems. For example, she'll forget conversations or things she was just told. However, she continues to live alone, with her son in a trailer immediately behind her. She pays her own bills, manages her own finances, and looks after her own medications. She continues to drive to familiar places, and is not getting lost. She also does most of her own cooking. She says her moods are okay, and she denies symptoms of depression. She denies changes in appetite. She describes her sleep as "fair", does report naps during the day. Initial MMSE 26, AFT  8. Workup showed borderline B12, nl TSH, mild atrophy on MRI. Rx'd Ativan prn for tremor.  REVIEW OF SYSTEMS: Full 14 system review of systems performed and notable only for joint pain, dizziness, confusion, restless leg, daytime sleepiness, joint swelling, back pain, aching muscles, walking difficulty, itching.  ALLERGIES: No Known Allergies  HOME MEDICATIONS: Outpatient Prescriptions Prior to Visit  Medication Sig Dispense Refill  . Calcium Carbonate-Vitamin D (CALCIUM-VITAMIN D) 500-200 MG-UNIT per tablet Take 1  tablet by mouth daily.      . hydrochlorothiazide (HYDRODIURIL) 25 MG tablet Take 25 mg by mouth daily.       Marland Kitchen. LORazepam (ATIVAN) 0.5 MG tablet Take 0.5 mg by mouth at bedtime as needed for anxiety.      . meclizine (ANTIVERT) 12.5 MG tablet Take 12.5 mg by mouth daily as needed for dizziness. PRN      . ranitidine (ZANTAC) 150 MG tablet Take 150 mg by mouth daily.       . vitamin B-12 (CYANOCOBALAMIN) 1000 MCG tablet Take 1,000 mcg by mouth daily.      . carbidopa-levodopa (SINEMET IR) 25-100 MG per tablet Take 0.5 tablets by mouth 3 (three) times daily.       No facility-administered medications prior to visit.    PHYSICAL EXAM Filed Vitals:   04/12/14 1133  BP: 114/69  Pulse: 68  Height: 5' (1.524 m)  Weight: 114 lb (51.71 kg)   Body mass index is 22.26 kg/(m^2). No exam data present No flowsheet data found.  No flowsheet data found.   Generalized: Well developed, in no acute distress  Head: normocephalic and atraumatic. Oropharynx benign  Neck: Supple, no carotid bruits  Cardiac: Regular rate rhythm, no murmur  Musculoskeletal: No deformity  Neurologic Exam  Mental Status: Awake, alert.  Language is fluent and comprehension intact. SOFT VOICE. POSITIVE SNOUT REFLEX. Cranial Nerves: Pupils are equal and reactive to light.  Visual fields are full to confrontation.  Conjugate eye movements are full and symmetric.  Facial sensation and strength are symmetric.  Hearing is intact.  Palate elevated symmetrically and uvula is midline.  Shoulder shrug is symmetric.  Tongue is midline. Motor: RESTING TREMOR BUE (RUE > LUE). MILD POSTURAL TREMOR, MOD-SEVERE BRADYKINESIA IN BUE AND BLE. COGWHEELING IN BUE. Normal bulk and tone.  BUE/BLE 4/5.  Sensory: Intact and symmetric to light touch. Coordination: NO DYSMETRIA. Gait and Station: STOOPED POSTURE. BARELY ABLE TO STAND AND TAKE STEPS.  ATAXIA WITH TURNING.   DIAGNOSTIC DATA (LABS, IMAGING, TESTING) - I reviewed patient records,  labs, notes, testing and imaging myself where available.  Lab Results  Component Value Date   WBC 6.5 03/12/2014   HGB 12.0 03/12/2014   HCT 37.5 03/12/2014   MCV 95.4 03/12/2014   PLT 165 03/12/2014      Component Value Date/Time   NA 141 03/12/2014 2055   K 3.8 03/12/2014 2055   CL 100 03/12/2014 2055   CO2 29 03/12/2014 2055   GLUCOSE 119* 03/12/2014 2055   BUN 31* 03/12/2014 2055   CREATININE 0.69 03/12/2014 2055   CALCIUM 10.0 03/12/2014 2055   GFRNONAA 75* 03/12/2014 2055   GFRAA 87* 03/12/2014 2055   03/13/14 CT CERVICAL SPINE FINDINGS Mild degenerative disc disease, most pronounced at C5-6 and C6-7. Severe diffuse bilateral degenerative facet disease. Slight anterolisthesis of C4 on C5, C5 on C6 and C6 on C7 related to facet disease. Prevertebral soft tissues are normal. Diffuse osteopenia. No fracture. No epidural or paraspinal hematoma. IMPRESSION: No  acute intracranial abnormality. No acute bony abnormality within the cervical spine.   03/13/14  HEAD FINDINGS There is atrophy and chronic small vessel disease changes. No acute intracranial abnormality. Specifically, no hemorrhage, hydrocephalus, mass lesion, acute infarction, or significant intracranial injury. No acute calvarial abnormality. Visualized paranasal sinuses and mastoids clear. Orbital soft tissues unremarkable.   ASSESSMENT: 80 y.o. right handed, female with advanced parkinson's disease and MCI. Still lives alone, but has good family support (son, grandson). High fall risk.  Dx: parkinson's disease + mild cognitive impairment  PLAN: 1. Increase carb/levo 25/100 up to or 1 tab three times a day, 30 minutes before meals. 2. Continue home health therapies, PT, OT 3. Use rollator walker at all times 4. Asked family to reassure her with hallucinations, help her distinguish what is real and what is not. 5. Follow up in 6 months, sooner as needed.  Meds ordered this encounter  Medications  . carbidopa-levodopa (SINEMET IR) 25-100 MG per  tablet    Sig: Take 1 tablet by mouth 3 (three) times daily. Give 30 minutes before meals.    Dispense:  90 tablet    Refill:  3    Order Specific Question:  Supervising Provider    Answer:  Suanne Marker [[id number]]   Return in about 6 months (around 10/13/2014) for Parkinsons Disease.  Tawny Asal Reona Zendejas, MSN, FNP-BC, A/GNP-C 04/12/2014, 12:51 PM [facility] [address] [place], [place] [address] [phone]  Note: This document was prepared with digital dictation and possible smart phrase technology. Any transcriptional errors that result from this process are unintentional.

## 2014-04-12 NOTE — Progress Notes (Signed)
I reviewed note and agree with plan.   VIKRAM R. PENUMALLI, MD 04/12/2014, 1:09 PM Certified in Neurology, Neurophysiology and Neuroimaging  [facility] [address], [address] [place], [place] [address] [phone]  

## 2014-04-12 NOTE — Patient Instructions (Signed)
1. Increase carb/levo 25/100 up to or 1 tab three times a day, 30 minutes before meals.  2. Continue home health therapies, PT, OT  3. Use rollator walker at all times  4. Try to reassure her with hallucinations, help her distinguish what is real and what is not.  4. Follow up in 6 months, sooner as needed.

## 2014-07-28 ENCOUNTER — Encounter: Payer: Self-pay | Admitting: Diagnostic Neuroimaging

## 2014-10-13 ENCOUNTER — Ambulatory Visit (INDEPENDENT_AMBULATORY_CARE_PROVIDER_SITE_OTHER): Payer: Medicare Other | Admitting: Diagnostic Neuroimaging

## 2014-10-13 ENCOUNTER — Encounter: Payer: Self-pay | Admitting: Diagnostic Neuroimaging

## 2014-10-13 ENCOUNTER — Ambulatory Visit: Payer: Medicare Other | Admitting: Nurse Practitioner

## 2014-10-13 VITALS — BP 154/88 | HR 67 | Temp 98.3°F

## 2014-10-13 DIAGNOSIS — G20 Parkinson's disease: Secondary | ICD-10-CM

## 2014-10-13 NOTE — Progress Notes (Signed)
PATIENT: Karla Phillips DOB: 1921/11/19  REASON FOR VISIT: routine follow up for Parkinson's Disease HISTORY FROM: patient  HISTORY OF PRESENT ILLNESS:  UPDATE 10/13/14 (VRP): Since last visit, patient continues to live alone, with sig help from family. She feels generally weak.  UPDATE 04/12/14 (LL): Patient comes in for yearly followup of Parkinson's disease. She had a fall at home approximately one month ago where her son found her on the ground but awake. She was taken to the hospital and get checked out, CT head and cervical spine were negative for anything acute. Son who accompanies her today states that she has been more weak in the last few months. After hospital discharge she was set up with home health physical therapy and occupational therapy and a nurse aide is coming to the house twice a week to help her bathe. Her son is still helping her with medicine administration and staying with her at night. She is no longer using the cane and using rolling walker at all times. She admits to having hallucinations of people and children in the house and once a dog, but she has not shared these hallucinations with family members. She is not upset by the hallucinations. She has continued to take only half a tablet of Sinemet 3 times a day. She has no complaints of nausea, trouble swallowing or constipation.  UPDATE 04/20/13 (VRP): Patient was seen in Feb 2014, comes in today on referral by Dr. Eula ListenHussain.  Patient states she is having dizziness, feels like she is going to fall over, like "my head is full of air."   Taking Sinemet 3 times a day before meals, 8am, 12-1pm, and 8-9pm.  Soft, tremulous voice. Son helps with medication administration.  Using a 4 point cane, has a rolling walker at home, but doesn't use much.  UPDATE 10/14/12: No new events. Not having a good day (decr sleep last night). Tremor and memory are stable. Son or grandson stay with her at night.   UPDATE 06/13/12:  Her and her son  feel that she has improved with her tremors.  Tolerating carbidopa-levodopa well without worsening dizziness or nause.  Her voice is soft.  Denies any falls.  She uses a 4 pronged cane.  she lives alone however her son lives behind her.  She is in the process of getting a life alert.    UPDATE 04/14/12: More tremor, poor handwriting, hoarse, quiet voice. No falls. Memory stable. Off donepezil.   UPDATE 01/25/11: Doing about the same re: tremor and memory complaints.  On donepezil without side effects. Does c/o "dizziness" intermittently and feeling "shaky" at times. Dizziness is more "lightheadedness" and feeling like she may pass out rather than vertigo. Pt has hx of anxiety. Gave up driving 2 months ago. No new complaints.    DATABASE:  Followed since October 2009 for tremor in her hands which bothers her writing, and gives her some difficulty holding a fork or spoon although she is able to feed herself. Her father and sister had a tremor in older age as well. Also short-term memory problems. For example, she'll forget conversations or things she was just told. However, she continues to live alone, with her son in a trailer immediately behind her. She pays her own bills, manages her own finances, and looks after her own medications. She continues to drive to familiar places, and is not getting lost. She also does most of her own cooking. She says her moods are okay, and she denies  symptoms of depression. She denies changes in appetite. She describes her sleep as "fair", does report naps during the day. Initial MMSE 26, AFT 8. Workup showed borderline B12, nl TSH, mild atrophy on MRI. Rx'd Ativan prn for tremor.   REVIEW OF SYSTEMS: Full 14 system review of systems performed and notable only for memory loss weakness joint pain aching muscles walking difficulty fatigue.   ALLERGIES: No Known Allergies  HOME MEDICATIONS: Outpatient Prescriptions Prior to Visit  Medication Sig Dispense Refill  . Calcium  Carbonate-Vitamin D (CALCIUM-VITAMIN D) 500-200 MG-UNIT per tablet Take 1 tablet by mouth daily.    . carbidopa-levodopa (SINEMET IR) 25-100 MG per tablet Take 1 tablet by mouth 3 (three) times daily. Give 30 minutes before meals. 90 tablet 3  . hydrochlorothiazide (HYDRODIURIL) 25 MG tablet Take 25 mg by mouth daily.     Marland Kitchen LORazepam (ATIVAN) 0.5 MG tablet Take 0.5 mg by mouth at bedtime as needed for anxiety.    . meclizine (ANTIVERT) 12.5 MG tablet Take 12.5 mg by mouth daily as needed for dizziness. PRN    . ranitidine (ZANTAC) 150 MG tablet Take 150 mg by mouth daily.     . vitamin B-12 (CYANOCOBALAMIN) 1000 MCG tablet Take 1,000 mcg by mouth daily.     No facility-administered medications prior to visit.    PHYSICAL EXAM Filed Vitals:   10/13/14 1335  BP: 154/88  Pulse: 67  Temp: 98.3 F (36.8 C)  TempSrc: Oral   Cannot calculate BMI with a height equal to zero.  Visual Acuity Screening   Right eye Left eye Both eyes  Without correction: unable unable   With correction:      No flowsheet data found.  No flowsheet data found.   Generalized: Well developed, in no acute distress; ELDERLY, FRAIL APPEARING Neck: Supple, no carotid bruits  Cardiac: Regular rate rhythm, no murmur    Neurologic Exam  Mental Status: Awake, alert.  Language is fluent and comprehension intact. SOFT VOICE. POSITIVE SNOUT REFLEX. Cranial Nerves: Pupils are equal and reactive to light.  Visual fields are full to confrontation.  Conjugate eye movements are full and symmetric.  Facial sensation and strength are symmetric.  Hearing is intact.  Palate elevated symmetrically and uvula is midline.  Shoulder shrug is symmetric.  Tongue is midline. Motor: RESTING TREMOR BUE (RUE > LUE). MILD POSTURAL TREMOR, MOD-SEVERE BRADYKINESIA IN BUE AND BLE. COGWHEELING IN BUE. Normal bulk and tone.  DIFFUSE WEAKNESS (3-4/5).  Sensory: Intact and symmetric to light touch. Coordination: NO DYSMETRIA. Gait and Station:  IN [place]; CANNOT STAND UNASSITED  DIAGNOSTIC DATA (LABS, IMAGING, TESTING) - I reviewed patient records, labs, notes, testing and imaging myself where available.  Lab Results  Component Value Date   WBC 6.5 03/12/2014   HGB 12.0 03/12/2014   HCT 37.5 03/12/2014   MCV 95.4 03/12/2014   PLT 165 03/12/2014      Component Value Date/Time   NA 141 03/12/2014 2055   K 3.8 03/12/2014 2055   CL 100 03/12/2014 2055   CO2 29 03/12/2014 2055   GLUCOSE 119* 03/12/2014 2055   BUN 31* 03/12/2014 2055   CREATININE 0.69 03/12/2014 2055   CALCIUM 10.0 03/12/2014 2055   GFRNONAA 75* 03/12/2014 2055   GFRAA 87* 03/12/2014 2055   03/13/14 CT CERVICAL SPINE FINDINGS Mild degenerative disc disease, most pronounced at C5-6 and C6-7. Severe diffuse bilateral degenerative facet disease. Slight anterolisthesis of C4 on C5, C5 on C6 and C6 on C7 related  to facet disease. Prevertebral soft tissues are normal. Diffuse osteopenia. No fracture. No epidural or paraspinal hematoma. IMPRESSION: No acute intracranial abnormality. No acute bony abnormality within the cervical spine.   03/13/14  HEAD FINDINGS There is atrophy and chronic small vessel disease changes. No acute intracranial abnormality. Specifically, no hemorrhage, hydrocephalus, mass lesion, acute infarction, or significant intracranial injury. No acute calvarial abnormality. Visualized paranasal sinuses and mastoids clear. Orbital soft tissues unremarkable.    ASSESSMENT: 81 y.o. right handed, female with advanced parkinson's disease and MCI. Still lives alone, but has good family support (son, grandson). High fall risk.  Dx: parkinson's disease + mild cognitive impairment  PLAN: 1. Continue carb/levo 25/100 TID 2. Continue home health therapies, PT, OT 3. Use rollator walker at all times 4. Asked family to reassure her with hallucinations, help her distinguish what is real and what is not. 5. Palliative care consult to help define goals of  care, MOST form, code status  Return in about 1 year (around 10/14/2015), or if symptoms worsen or fail to improve.  Suanne Marker, MD 10/13/2014, 2:45 PM Certified in Neurology, Neurophysiology and Neuroimaging  [facility] [address], [address] [place], [place] [address] [phone]

## 2015-03-06 ENCOUNTER — Emergency Department (HOSPITAL_COMMUNITY): Payer: Medicare Other

## 2015-03-06 ENCOUNTER — Encounter (HOSPITAL_COMMUNITY): Payer: Self-pay

## 2015-03-06 ENCOUNTER — Inpatient Hospital Stay (HOSPITAL_COMMUNITY)
Admission: EM | Admit: 2015-03-06 | Discharge: 2015-03-09 | DRG: 087 | Disposition: A | Discharge status: Skilled Nursing Facility | Payer: Medicare Other | Attending: Internal Medicine | Admitting: Internal Medicine

## 2015-03-06 DIAGNOSIS — R296 Repeated falls: Secondary | ICD-10-CM | POA: Diagnosis present

## 2015-03-06 DIAGNOSIS — Z515 Encounter for palliative care: Secondary | ICD-10-CM

## 2015-03-06 DIAGNOSIS — S0240CA Maxillary fracture, right side, initial encounter for closed fracture: Secondary | ICD-10-CM

## 2015-03-06 DIAGNOSIS — Z79899 Other long term (current) drug therapy: Secondary | ICD-10-CM

## 2015-03-06 DIAGNOSIS — S02402A Zygomatic fracture, unspecified, initial encounter for closed fracture: Secondary | ICD-10-CM | POA: Diagnosis present

## 2015-03-06 DIAGNOSIS — G20 Parkinson's disease: Secondary | ICD-10-CM | POA: Diagnosis present

## 2015-03-06 DIAGNOSIS — W19XXXA Unspecified fall, initial encounter: Secondary | ICD-10-CM

## 2015-03-06 DIAGNOSIS — Y92000 Kitchen of unspecified non-institutional (private) residence as  the place of occurrence of the external cause: Secondary | ICD-10-CM

## 2015-03-06 DIAGNOSIS — S02401A Maxillary fracture, unspecified, initial encounter for closed fracture: Secondary | ICD-10-CM | POA: Diagnosis present

## 2015-03-06 DIAGNOSIS — K219 Gastro-esophageal reflux disease without esophagitis: Secondary | ICD-10-CM | POA: Diagnosis present

## 2015-03-06 DIAGNOSIS — S06360A Traumatic hemorrhage of cerebrum, unspecified, without loss of consciousness, initial encounter: Secondary | ICD-10-CM | POA: Diagnosis not present

## 2015-03-06 DIAGNOSIS — S0630AA Unspecified focal traumatic brain injury with loss of consciousness status unknown, initial encounter: Secondary | ICD-10-CM | POA: Diagnosis present

## 2015-03-06 DIAGNOSIS — R627 Adult failure to thrive: Secondary | ICD-10-CM | POA: Diagnosis present

## 2015-03-06 DIAGNOSIS — M8580 Other specified disorders of bone density and structure, unspecified site: Secondary | ICD-10-CM | POA: Diagnosis present

## 2015-03-06 DIAGNOSIS — S06309A Unspecified focal traumatic brain injury with loss of consciousness of unspecified duration, initial encounter: Secondary | ICD-10-CM | POA: Diagnosis present

## 2015-03-06 DIAGNOSIS — E860 Dehydration: Secondary | ICD-10-CM | POA: Diagnosis present

## 2015-03-06 DIAGNOSIS — R1310 Dysphagia, unspecified: Secondary | ICD-10-CM | POA: Diagnosis present

## 2015-03-06 DIAGNOSIS — S0990XA Unspecified injury of head, initial encounter: Secondary | ICD-10-CM | POA: Diagnosis not present

## 2015-03-06 DIAGNOSIS — S0292XA Unspecified fracture of facial bones, initial encounter for closed fracture: Secondary | ICD-10-CM | POA: Diagnosis present

## 2015-03-06 DIAGNOSIS — W1830XA Fall on same level, unspecified, initial encounter: Secondary | ICD-10-CM | POA: Diagnosis present

## 2015-03-06 DIAGNOSIS — Z66 Do not resuscitate: Secondary | ICD-10-CM | POA: Diagnosis present

## 2015-03-06 DIAGNOSIS — I10 Essential (primary) hypertension: Secondary | ICD-10-CM | POA: Diagnosis present

## 2015-03-06 DIAGNOSIS — I615 Nontraumatic intracerebral hemorrhage, intraventricular: Secondary | ICD-10-CM

## 2015-03-06 DIAGNOSIS — R413 Other amnesia: Secondary | ICD-10-CM | POA: Diagnosis present

## 2015-03-06 LAB — CBC
HEMATOCRIT: 38.7 % (ref 36.0–46.0)
HEMOGLOBIN: 12.4 g/dL (ref 12.0–15.0)
MCH: 30 pg (ref 26.0–34.0)
MCHC: 32 g/dL (ref 30.0–36.0)
MCV: 93.5 fL (ref 78.0–100.0)
Platelets: 179 10*3/uL (ref 150–400)
RBC: 4.14 MIL/uL (ref 3.87–5.11)
RDW: 12.9 % (ref 11.5–15.5)
WBC: 6.9 10*3/uL (ref 4.0–10.5)

## 2015-03-06 LAB — URINE MICROSCOPIC-ADD ON

## 2015-03-06 LAB — URINALYSIS, ROUTINE W REFLEX MICROSCOPIC
Glucose, UA: NEGATIVE mg/dL
HGB URINE DIPSTICK: NEGATIVE
KETONES UR: 15 mg/dL — AB
Leukocytes, UA: NEGATIVE
NITRITE: NEGATIVE
Protein, ur: 30 mg/dL — AB
SPECIFIC GRAVITY, URINE: 1.029 (ref 1.005–1.030)
UROBILINOGEN UA: 1 mg/dL (ref 0.0–1.0)
pH: 5.5 (ref 5.0–8.0)

## 2015-03-06 LAB — BASIC METABOLIC PANEL
ANION GAP: 9 (ref 5–15)
BUN: 26 mg/dL — ABNORMAL HIGH (ref 6–20)
CALCIUM: 9.6 mg/dL (ref 8.9–10.3)
CHLORIDE: 103 mmol/L (ref 101–111)
CO2: 29 mmol/L (ref 22–32)
CREATININE: 0.75 mg/dL (ref 0.44–1.00)
GFR calc Af Amer: >60 mL/min (ref >60)
GFR calc non Af Amer: >60 mL/min (ref >60)
Glucose, Bld: 146 mg/dL — ABNORMAL HIGH (ref 65–99)
Potassium: 3.5 mmol/L (ref 3.5–5.1)
Sodium: 141 mmol/L (ref 135–145)

## 2015-03-06 LAB — I-STAT TROPONIN, ED: Troponin i, poc: 0.02 ng/mL (ref 0.00–0.08)

## 2015-03-06 MED ORDER — CEPHALEXIN 250 MG PO CAPS: 500.0000 mg | ORAL_CAPSULE | Freq: Once | ORAL | Status: DC

## 2015-03-06 MED ORDER — SODIUM CHLORIDE 0.9 % IV BOLUS (SEPSIS)
1000.0000 mL | Freq: Once | INTRAVENOUS | Status: AC
Start: 2015-03-06 — End: 2015-03-06
  Administered 2015-03-06: 1000 mL via INTRAVENOUS

## 2015-03-06 NOTE — ED Notes (Signed)
Pt arrived via ems c/o fall at home found on the floor.  Pt had unwitnessed fall at home, live-in son reports was gone about an hour.  Pt has blood on right side of head, abrasion to right eye, right eye swelling, blood from nose and mouth. Pt has yellow/green bruising to left arm, left side of head, right knee, scabbed abrasion to left elbow and left knee.

## 2015-03-06 NOTE — ED Provider Notes (Signed)
CSN: [id number]     Arrival date & time 03/06/15  2000 History   First MD Initiated Contact with Patient 03/06/15 2007     Chief Complaint  Patient presents with  . Fall     (Consider location/radiation/quality/duration/timing/severity/associated sxs/prior Treatment) HPI Comments: Found down at home. Lives with son. He states he left for one hour and he found her down.   Patient is a 81 y.o. female presenting with fall. The history is provided by the patient.  Fall This is a new problem. The current episode started 1 to 2 hours ago. The problem occurs daily. The problem has not changed since onset.Associated symptoms include headaches. Pertinent negatives include no chest pain and no abdominal pain. Nothing aggravates the symptoms. Nothing relieves the symptoms.    Past Medical History  Diagnosis Date  . Hypertension   . Osteopenia   . GERD (gastroesophageal reflux disease)   . Memory loss   . Tremor   . Parkinson disease    Past Surgical History  Procedure Laterality Date  . Eye surgery    . Breast surgery    . Tonsillectomy     History reviewed. No pertinent family history. History  Substance Use Topics  . Smoking status: Never Smoker   . Smokeless tobacco: Not on file  . Alcohol Use: No   OB History    No data available     Review of Systems  Constitutional: Negative for fever and chills.  Cardiovascular: Negative for chest pain.  Gastrointestinal: Negative for abdominal pain.  Neurological: Positive for headaches.  All other systems reviewed and are negative.     Allergies  Review of patient's allergies indicates no known allergies.  Home Medications   Prior to Admission medications   Medication Sig Start Date End Date Taking? Authorizing Provider  carbidopa-levodopa (SINEMET IR) 25-100 MG per tablet Take 1 tablet by mouth 3 (three) times daily. Give 30 minutes before meals. 04/12/14  Yes Ronal Fear, NP  donepezil (ARICEPT) 10 MG tablet Take 10 mg by  mouth at bedtime.   Yes Historical Provider, MD  hydrochlorothiazide (HYDRODIURIL) 25 MG tablet Take 25 mg by mouth daily.  04/03/13  Yes Historical Provider, MD  LORazepam (ATIVAN) 0.5 MG tablet Take 0.5 mg by mouth daily.    Yes Historical Provider, MD  meclizine (ANTIVERT) 12.5 MG tablet Take 12.5 mg by mouth daily as needed for dizziness. PRN   Yes Historical Provider, MD  ranitidine (ZANTAC) 150 MG tablet Take 150 mg by mouth 2 (two) times daily.  04/03/13  Yes Historical Provider, MD  Calcium Carbonate-Vitamin D (CALCIUM-VITAMIN D) 500-200 MG-UNIT per tablet Take 1 tablet by mouth daily.    Historical Provider, MD  vitamin B-12 (CYANOCOBALAMIN) 1000 MCG tablet Take 1,000 mcg by mouth daily.    Historical Provider, MD   BP 131/78 mmHg  Pulse 122  Temp(Src) 98.9 F (37.2 C) (Axillary)  Resp 18  SpO2 96% Physical Exam  Constitutional: She appears well-developed and well-nourished. No distress.  HENT:  Head: Normocephalic.    Mouth/Throat: Oropharynx is clear and moist.  Eyes: EOM are normal. Pupils are equal, round, and reactive to light.  Neck: Normal range of motion. Neck supple.  Cardiovascular: Normal rate and regular rhythm.  Exam reveals no friction rub.   No murmur heard. Pulmonary/Chest: Effort normal and breath sounds normal. No respiratory distress. She has no wheezes. She has no rales.  Abdominal: Soft. She exhibits no distension. There is no tenderness. There is  no rebound.  Musculoskeletal: Normal range of motion. She exhibits no edema.       Arms:      Legs: Neurological: She is alert.  Skin: No rash noted. She is not diaphoretic.  Nursing note and vitals reviewed.   ED Course  Procedures (including critical care time) Labs Review Labs Reviewed  CBC  BASIC METABOLIC PANEL  URINALYSIS, ROUTINE W REFLEX MICROSCOPIC (NOT AT [facility])  I-STAT CG4 LACTIC ACID, ED    Imaging Review Dg Chest 2 View  03/06/2015   CLINICAL DATA:  Fall earlier this day.  EXAM: CHEST  2  VIEW  COMPARISON:  03/12/2014  FINDINGS: Lower lung volumes from prior exam. Cardiomediastinal contours are unchanged, heart remains at the upper limits of normal in size. Dependent bibasilar opacities, likely atelectasis, best appreciated on the lateral view. No confluent airspace disease to suggest pneumonia. No pulmonary edema, pleural effusion, or pneumothorax. There is exaggerated thoracic kyphosis, no evidence of acute osseous abnormality.  IMPRESSION: Hypoventilatory chest with bibasilar atelectasis.   Electronically Signed   By: Rubye Oaks M.D.   On: 03/06/2015 21:30   Ct Head Wo Contrast  03/06/2015   CLINICAL DATA:  Unwitnessed fall.  Found on floor.  EXAM: CT HEAD WITHOUT CONTRAST  CT MAXILLOFACIAL WITHOUT CONTRAST  CT CERVICAL SPINE WITHOUT CONTRAST  TECHNIQUE: Multidetector CT imaging of the head, cervical spine, and maxillofacial structures were performed using the standard protocol without intravenous contrast. Multiplanar CT image reconstructions of the cervical spine and maxillofacial structures were also generated.  COMPARISON:  03/12/2014  FINDINGS: CT HEAD FINDINGS  There is a small volume blood collected dependently in the lateral ventricular occipital horns bilaterally. No other intracranial hemorrhage is evident. There is no extra-axial fluid collection. There is moderate generalized atrophy. There is moderate hemispheric white matter hypodensity consistent with chronic small vessel ischemic disease. The calvarium and skullbase are intact.  CT MAXILLOFACIAL FINDINGS  There is a mildly displaced right lateral orbital fracture. Orbital floor is intact. Medial orbital wall is intact. There is a right zygomatic arch fracture. There are mildly depressed fractures of the right anterior and posterior maxillary sinus walls. There is a blood fluid level in the right maxillary sinus. The right maxillary sinus fracture line extends down into the right alveolar ridge, nondisplaced.  There is soft  tissue or fluid occluding the left external auditory canal without evidence of adjacent fracture. This more likely is cerumen.  CT CERVICAL SPINE FINDINGS  The vertebral column, pedicles and facet articulations are intact. There is no evidence of acute fracture. No acute soft tissue abnormalities are evident.  Moderately severe degenerative changes are present, predominantly involving the facet articulations from C3 through C7. There is grade 1 degenerative-appearing anterolisthesis at C4-5 and C5-6.  IMPRESSION: 1. Very small volume intracranial hemorrhage collected dependently in the lateral ventricular occipital horns 2. Fractures of the right lateral orbit, right zygomatic arch, anterior and posterior right maxillary sinus walls. There is caudal extension from the right maxillary sinus fracture into the right alveolar ridge, nondisplaced. The orbital floor is intact. Pterygoid plates are intact. 3. Negative for acute cervical spine fracture. These results were called by telephone at the time of interpretation on 03/06/2015 at 10:05 pm to Dr. Elwin Mocha , who verbally acknowledged these results.   Electronically Signed   By: Ellery Plunk M.D.   On: 03/06/2015 22:12   Ct Cervical Spine Wo Contrast  03/06/2015   CLINICAL DATA:  Unwitnessed fall.  Found on floor.  EXAM: CT HEAD WITHOUT CONTRAST  CT MAXILLOFACIAL WITHOUT CONTRAST  CT CERVICAL SPINE WITHOUT CONTRAST  TECHNIQUE: Multidetector CT imaging of the head, cervical spine, and maxillofacial structures were performed using the standard protocol without intravenous contrast. Multiplanar CT image reconstructions of the cervical spine and maxillofacial structures were also generated.  COMPARISON:  03/12/2014  FINDINGS: CT HEAD FINDINGS  There is a small volume blood collected dependently in the lateral ventricular occipital horns bilaterally. No other intracranial hemorrhage is evident. There is no extra-axial fluid collection. There is moderate  generalized atrophy. There is moderate hemispheric white matter hypodensity consistent with chronic small vessel ischemic disease. The calvarium and skullbase are intact.  CT MAXILLOFACIAL FINDINGS  There is a mildly displaced right lateral orbital fracture. Orbital floor is intact. Medial orbital wall is intact. There is a right zygomatic arch fracture. There are mildly depressed fractures of the right anterior and posterior maxillary sinus walls. There is a blood fluid level in the right maxillary sinus. The right maxillary sinus fracture line extends down into the right alveolar ridge, nondisplaced.  There is soft tissue or fluid occluding the left external auditory canal without evidence of adjacent fracture. This more likely is cerumen.  CT CERVICAL SPINE FINDINGS  The vertebral column, pedicles and facet articulations are intact. There is no evidence of acute fracture. No acute soft tissue abnormalities are evident.  Moderately severe degenerative changes are present, predominantly involving the facet articulations from C3 through C7. There is grade 1 degenerative-appearing anterolisthesis at C4-5 and C5-6.  IMPRESSION: 1. Very small volume intracranial hemorrhage collected dependently in the lateral ventricular occipital horns 2. Fractures of the right lateral orbit, right zygomatic arch, anterior and posterior right maxillary sinus walls. There is caudal extension from the right maxillary sinus fracture into the right alveolar ridge, nondisplaced. The orbital floor is intact. Pterygoid plates are intact. 3. Negative for acute cervical spine fracture. These results were called by telephone at the time of interpretation on 03/06/2015 at 10:05 pm to Dr. Elwin Mocha , who verbally acknowledged these results.   Electronically Signed   By: Ellery Plunk M.D.   On: 03/06/2015 22:12   Ct Maxillofacial Wo Cm  03/06/2015   CLINICAL DATA:  Unwitnessed fall.  Found on floor.  EXAM: CT HEAD WITHOUT CONTRAST  CT  MAXILLOFACIAL WITHOUT CONTRAST  CT CERVICAL SPINE WITHOUT CONTRAST  TECHNIQUE: Multidetector CT imaging of the head, cervical spine, and maxillofacial structures were performed using the standard protocol without intravenous contrast. Multiplanar CT image reconstructions of the cervical spine and maxillofacial structures were also generated.  COMPARISON:  03/12/2014  FINDINGS: CT HEAD FINDINGS  There is a small volume blood collected dependently in the lateral ventricular occipital horns bilaterally. No other intracranial hemorrhage is evident. There is no extra-axial fluid collection. There is moderate generalized atrophy. There is moderate hemispheric white matter hypodensity consistent with chronic small vessel ischemic disease. The calvarium and skullbase are intact.  CT MAXILLOFACIAL FINDINGS  There is a mildly displaced right lateral orbital fracture. Orbital floor is intact. Medial orbital wall is intact. There is a right zygomatic arch fracture. There are mildly depressed fractures of the right anterior and posterior maxillary sinus walls. There is a blood fluid level in the right maxillary sinus. The right maxillary sinus fracture line extends down into the right alveolar ridge, nondisplaced.  There is soft tissue or fluid occluding the left external auditory canal without evidence of adjacent fracture. This more likely is cerumen.  CT CERVICAL SPINE  FINDINGS  The vertebral column, pedicles and facet articulations are intact. There is no evidence of acute fracture. No acute soft tissue abnormalities are evident.  Moderately severe degenerative changes are present, predominantly involving the facet articulations from C3 through C7. There is grade 1 degenerative-appearing anterolisthesis at C4-5 and C5-6.  IMPRESSION: 1. Very small volume intracranial hemorrhage collected dependently in the lateral ventricular occipital horns 2. Fractures of the right lateral orbit, right zygomatic arch, anterior and  posterior right maxillary sinus walls. There is caudal extension from the right maxillary sinus fracture into the right alveolar ridge, nondisplaced. The orbital floor is intact. Pterygoid plates are intact. 3. Negative for acute cervical spine fracture. These results were called by telephone at the time of interpretation on 03/06/2015 at 10:05 pm to Dr. Elwin Mocha , who verbally acknowledged these results.   Electronically Signed   By: Ellery Plunk M.D.   On: 03/06/2015 22:12     EKG Interpretation   Date/Time:  Sunday March 06 2015 20:09:56 EDT Ventricular Rate:  80 PR Interval:    QRS Duration: 123 QT Interval:  493 QTC Calculation: 569 R Axis:   -74 Text Interpretation:  Atrial fibrillation Ventricular premature complex  Aberrant conduction of SV complex(es) Nonspecific IVCD with LAD Left  ventricular hypertrophy Inferior infarct, old Anterior infarct, old  Artifact in lead(s) I II III aVR aVL aVF V1 V2 No significant change since  last tracing Confirmed by Gwendolyn Grant  MD, Jessicca Stitzer ([id number]) on 03/06/2015 8:13:29 PM      MDM   Final diagnoses:  Fall  Intraventricular hemorrhage  Right maxillary fracture, closed, initial encounter  Zygomatic arch fracture, closed, initial encounter    81 year old female here after being found down at home. Son stated he left and she fell in the kitchen. Patient has multiple different colored bruises. She denies any pain. She is disoriented to situation. She appears dehydrated with dry mucous membranes. She's tachycardic also. Will image her head and check labs. CT Head shows trace intraventricular hemorrhage. I spoke with Dr. Conchita Paris with Neurosurgery - nothing to do. CT Face shows R tripod fracture - I spoke with Dr. Suszanne Conners with ENT who will see her. Patient too weak to get out of bed and she had a small amount of ventricular ectopy on her cardiac monitor. Labs ok.  Admitted. I spoke with son who states she didn't want life support, however he  doesn't have DNR paperwork here.    Elwin Mocha, MD 03/07/15 [phone]

## 2015-03-07 DIAGNOSIS — S06360A Traumatic hemorrhage of cerebrum, unspecified, without loss of consciousness, initial encounter: Secondary | ICD-10-CM | POA: Diagnosis present

## 2015-03-07 DIAGNOSIS — S02402A Zygomatic fracture, unspecified, initial encounter for closed fracture: Secondary | ICD-10-CM | POA: Diagnosis present

## 2015-03-07 DIAGNOSIS — Z515 Encounter for palliative care: Secondary | ICD-10-CM | POA: Diagnosis not present

## 2015-03-07 DIAGNOSIS — I615 Nontraumatic intracerebral hemorrhage, intraventricular: Secondary | ICD-10-CM | POA: Diagnosis not present

## 2015-03-07 DIAGNOSIS — S0990XA Unspecified injury of head, initial encounter: Secondary | ICD-10-CM | POA: Diagnosis present

## 2015-03-07 DIAGNOSIS — E860 Dehydration: Secondary | ICD-10-CM | POA: Diagnosis present

## 2015-03-07 DIAGNOSIS — S0240CA Maxillary fracture, right side, initial encounter for closed fracture: Secondary | ICD-10-CM | POA: Insufficient documentation

## 2015-03-07 DIAGNOSIS — G20 Parkinson's disease: Secondary | ICD-10-CM | POA: Diagnosis not present

## 2015-03-07 DIAGNOSIS — S02401A Maxillary fracture, unspecified, initial encounter for closed fracture: Secondary | ICD-10-CM

## 2015-03-07 DIAGNOSIS — Z66 Do not resuscitate: Secondary | ICD-10-CM | POA: Diagnosis present

## 2015-03-07 DIAGNOSIS — I10 Essential (primary) hypertension: Secondary | ICD-10-CM | POA: Diagnosis not present

## 2015-03-07 DIAGNOSIS — S0292XA Unspecified fracture of facial bones, initial encounter for closed fracture: Secondary | ICD-10-CM | POA: Diagnosis present

## 2015-03-07 DIAGNOSIS — R1310 Dysphagia, unspecified: Secondary | ICD-10-CM | POA: Diagnosis present

## 2015-03-07 DIAGNOSIS — R296 Repeated falls: Secondary | ICD-10-CM | POA: Diagnosis present

## 2015-03-07 DIAGNOSIS — R413 Other amnesia: Secondary | ICD-10-CM | POA: Diagnosis present

## 2015-03-07 DIAGNOSIS — R627 Adult failure to thrive: Secondary | ICD-10-CM | POA: Diagnosis present

## 2015-03-07 DIAGNOSIS — S06309A Unspecified focal traumatic brain injury with loss of consciousness of unspecified duration, initial encounter: Secondary | ICD-10-CM | POA: Diagnosis present

## 2015-03-07 DIAGNOSIS — S06369A Traumatic hemorrhage of cerebrum, unspecified, with loss of consciousness of unspecified duration, initial encounter: Secondary | ICD-10-CM | POA: Diagnosis not present

## 2015-03-07 DIAGNOSIS — K219 Gastro-esophageal reflux disease without esophagitis: Secondary | ICD-10-CM | POA: Diagnosis present

## 2015-03-07 DIAGNOSIS — M8580 Other specified disorders of bone density and structure, unspecified site: Secondary | ICD-10-CM | POA: Diagnosis present

## 2015-03-07 DIAGNOSIS — S0630AA Unspecified focal traumatic brain injury with loss of consciousness status unknown, initial encounter: Secondary | ICD-10-CM | POA: Diagnosis present

## 2015-03-07 DIAGNOSIS — Z79899 Other long term (current) drug therapy: Secondary | ICD-10-CM | POA: Diagnosis not present

## 2015-03-07 DIAGNOSIS — W1830XA Fall on same level, unspecified, initial encounter: Secondary | ICD-10-CM | POA: Diagnosis present

## 2015-03-07 DIAGNOSIS — Y92000 Kitchen of unspecified non-institutional (private) residence as  the place of occurrence of the external cause: Secondary | ICD-10-CM | POA: Diagnosis not present

## 2015-03-07 MED ORDER — ONDANSETRON HCL 4 MG/2ML IJ SOLN: 4.0000 mg | Freq: Four times a day (QID) | INTRAMUSCULAR | Status: DC | PRN

## 2015-03-07 MED ORDER — ACETAMINOPHEN 650 MG RE SUPP: 650.0000 mg | Freq: Four times a day (QID) | RECTAL | Status: DC | PRN

## 2015-03-07 MED ORDER — CARBIDOPA-LEVODOPA 25-100 MG PO TABS
1.0000 | ORAL_TABLET | Freq: Three times a day (TID) | ORAL | Status: DC
  Administered 2015-03-07 – 2015-03-09 (×6): 1 via ORAL
  Filled 2015-03-07 (×6): qty 1

## 2015-03-07 MED ORDER — LORAZEPAM 1 MG PO TABS: 0.5000 mg | ORAL_TABLET | ORAL | Status: DC | PRN

## 2015-03-07 MED ORDER — FENTANYL CITRATE (PF) 100 MCG/2ML IJ SOLN: 25.0000 ug | Freq: Four times a day (QID) | INTRAMUSCULAR | Status: DC | PRN

## 2015-03-07 MED ORDER — LORAZEPAM 2 MG/ML IJ SOLN: 0.5000 mg | INTRAMUSCULAR | Status: DC | PRN

## 2015-03-07 MED ORDER — MORPHINE SULFATE (CONCENTRATE) 10 MG/0.5ML PO SOLN: 5.0000 mg | ORAL | Status: DC | PRN

## 2015-03-07 MED ORDER — ACETAMINOPHEN 325 MG PO TABS: 650.0000 mg | ORAL_TABLET | Freq: Four times a day (QID) | ORAL | Status: DC | PRN

## 2015-03-07 MED ORDER — CLONIDINE HCL 0.1 MG/24HR TD PTWK
0.1000 mg | MEDICATED_PATCH | TRANSDERMAL | Status: DC
  Administered 2015-03-07: 0.1 mg via TRANSDERMAL
  Filled 2015-03-07: qty 1

## 2015-03-07 MED ORDER — HYDRALAZINE HCL 20 MG/ML IJ SOLN
20.0000 mg | Freq: Once | INTRAMUSCULAR | Status: AC
  Administered 2015-03-07: 20 mg via INTRAVENOUS
  Filled 2015-03-07: qty 1

## 2015-03-07 MED ORDER — ONDANSETRON HCL 4 MG PO TABS: 4.0000 mg | ORAL_TABLET | Freq: Four times a day (QID) | ORAL | Status: DC | PRN

## 2015-03-07 MED ORDER — LORAZEPAM 2 MG/ML PO CONC: 0.5000 mg | ORAL | Status: DC | PRN

## 2015-03-07 NOTE — ED Notes (Signed)
High blood pressure reported to Dr. Mora Bellmanni and Dr Allena KatzPatel

## 2015-03-07 NOTE — ED Notes (Signed)
Report attempted 

## 2015-03-07 NOTE — Progress Notes (Addendum)
Patient's daughter refused PM vitals per NT, will check later to see if this writer can retry again. Will continue to monitor.   2315-Per NT, patient's daughter does not want patient to be disturbed since she is resting, will reattempt at 130.

## 2015-03-07 NOTE — Progress Notes (Signed)
UR completed.    Frederick Marro W. Jovanne Riggenbach, RN, BSN  Trauma/Neuro ICU Case Manager [phone] 

## 2015-03-07 NOTE — Progress Notes (Signed)
11:34 AM I agree with HPI/GPe and A/P per Dr. Edsel PetrinPranav PAtel   81 y/o ? multiple recent falls, Parkinson's since ?06/2008, hallucinations and Adult Failure to thrive admitted early am 03/07/15 with IC hemorrhage following a fall NS and ENT consulted as per HPI    Patient Active Problem List   Diagnosis Date Noted  . Intracranial hemorrhage following injury-[place] med consulted-  Appreciate input Add low dose roxanol Liberalize diet D/c majority of meds See d/c summary 03/07/2015  . Closed fracture of facial bones 03/07/2015  . Accelerated hypertension-stop aggressive bp control 03/07/2015  . Parkinson disease-end stage-continue meds for comfort 04/12/2014   Pleas KochJai Keylie Beavers, MD [facility] [phone]

## 2015-03-07 NOTE — Progress Notes (Signed)
Patient admitted to 4N at [id number] via stretcher. Patient alert and oriented to self. adm

## 2015-03-07 NOTE — Consult Note (Signed)
Reason for Consult: Facial trauma s/p fall  HPI:  Karla Phillips is a 81 y.o. female with a history of Parkinson's disease, gait difficulty, memory loss, GERD, and hypertension, who was transported to the [facility] yesterday for evaluation and treatment after a fall. The patient has been progressively having declining mental status with declining health as well. Based on documentation available from February 2016 from neurology, palliative care consult was recommended. On her CT scan, a minimally displaced right tripod fracture was noted.   Past Medical History  Diagnosis Date  . Hypertension   . Osteopenia   . GERD (gastroesophageal reflux disease)   . Memory loss   . Tremor   . Parkinson disease     Past Surgical History  Procedure Laterality Date  . Eye surgery    . Breast surgery    . Tonsillectomy      History reviewed. No pertinent family history.  Social History:  reports that she has never smoked. She does not have any smokeless tobacco history on file. She reports that she does not drink alcohol. Her drug history is not on file.  Allergies: No Known Allergies  Prior to Admission medications   Medication Sig Start Date End Date Taking? Authorizing Provider  carbidopa-levodopa (SINEMET IR) 25-100 MG per tablet Take 1 tablet by mouth 3 (three) times daily. Give 30 minutes before meals. 04/12/14  Yes Ronal Fear, NP  donepezil (ARICEPT) 10 MG tablet Take 10 mg by mouth at bedtime.   Yes Historical Provider, MD  hydrochlorothiazide (HYDRODIURIL) 25 MG tablet Take 25 mg by mouth daily.  04/03/13  Yes Historical Provider, MD  LORazepam (ATIVAN) 0.5 MG tablet Take 0.5 mg by mouth daily.    Yes Historical Provider, MD  meclizine (ANTIVERT) 12.5 MG tablet Take 12.5 mg by mouth daily as needed for dizziness. PRN   Yes Historical Provider, MD  ranitidine (ZANTAC) 150 MG tablet Take 150 mg by mouth 2 (two) times daily.  04/03/13  Yes Historical Provider, MD    Medications:  I have  reviewed the patient's current medications. Scheduled: . carbidopa-levodopa  1 tablet Oral TID WC   [id number] **OR** acetaminophen, fentaNYL (SUBLIMAZE) injection, LORazepam **OR** [DISCONTINUED] LORazepam **OR** LORazepam, morphine CONCENTRATE, ondansetron **OR** ondansetron (ZOFRAN) IV  Results for orders placed or performed during the hospital encounter of 03/06/15 (from the past 48 hour(s))  I-stat troponin, ED     Status: None   Collection Time: 03/06/15  8:57 PM  Result Value Ref Range   Troponin i, poc 0.02 0.00 - 0.08 ng/mL   Comment 3            Comment: Due to the release kinetics of cTnI, a negative result within the first hours of the onset of symptoms does not rule out myocardial infarction with certainty. If myocardial infarction is still suspected, repeat the test at appropriate intervals.   CBC     Status: None   Collection Time: 03/06/15  9:03 PM  Result Value Ref Range   WBC 6.9 4.0 - 10.5 K/uL   RBC 4.14 3.87 - 5.11 MIL/uL   Hemoglobin 12.4 12.0 - 15.0 g/dL   HCT 99.1 20.5 - 71.2 %   MCV 93.5 78.0 - 100.0 fL   MCH 30.0 26.0 - 34.0 pg   MCHC 32.0 30.0 - 36.0 g/dL   RDW 97.0 45.3 - 42.5 %   Platelets 179 150 - 400 K/uL  Basic metabolic panel     Status: Abnormal  Collection Time: 03/06/15  9:03 PM  Result Value Ref Range   Sodium 141 135 - 145 mmol/L   Potassium 3.5 3.5 - 5.1 mmol/L   Chloride 103 101 - 111 mmol/L   CO2 29 22 - 32 mmol/L   Glucose, Bld 146 (H) 65 - 99 mg/dL   BUN 26 (H) 6 - 20 mg/dL   Creatinine, Ser 0.75 0.44 - 1.00 mg/dL   Calcium 9.6 8.9 - 10.3 mg/dL   GFR calc non Af Amer >60 >60 mL/min   GFR calc Af Amer >60 >60 mL/min    Comment: (NOTE) The eGFR has been calculated using the CKD EPI equation. This calculation has not been validated in all clinical situations. eGFR's persistently <60 mL/min signify possible Chronic Kidney Disease.    Anion gap 9 5 - 15  Urinalysis, Routine w reflex microscopic (not at [facility])     Status:  Abnormal   Collection Time: 03/06/15 10:24 PM  Result Value Ref Range   Color, Urine AMBER (A) YELLOW    Comment: BIOCHEMICALS MAY BE AFFECTED BY COLOR   APPearance CLOUDY (A) CLEAR   Specific Gravity, Urine 1.029 1.005 - 1.030   pH 5.5 5.0 - 8.0   Glucose, UA NEGATIVE NEGATIVE mg/dL   Hgb urine dipstick NEGATIVE NEGATIVE   Bilirubin Urine SMALL (A) NEGATIVE   Ketones, ur 15 (A) NEGATIVE mg/dL   Protein, ur 30 (A) NEGATIVE mg/dL   Urobilinogen, UA 1.0 0.0 - 1.0 mg/dL   Nitrite NEGATIVE NEGATIVE   Leukocytes, UA NEGATIVE NEGATIVE  Urine microscopic-add on     Status: Abnormal   Collection Time: 03/06/15 10:24 PM  Result Value Ref Range   Squamous Epithelial / LPF RARE RARE   Bacteria, UA RARE RARE   Casts GRANULAR CAST (A) NEGATIVE   Urine-Other MUCOUS PRESENT     Dg Chest 2 View  03/06/2015   CLINICAL DATA:  Fall earlier this day.  EXAM: CHEST  2 VIEW  COMPARISON:  03/12/2014  FINDINGS: Lower lung volumes from prior exam. Cardiomediastinal contours are unchanged, heart remains at the upper limits of normal in size. Dependent bibasilar opacities, likely atelectasis, best appreciated on the lateral view. No confluent airspace disease to suggest pneumonia. No pulmonary edema, pleural effusion, or pneumothorax. There is exaggerated thoracic kyphosis, no evidence of acute osseous abnormality.  IMPRESSION: Hypoventilatory chest with bibasilar atelectasis.   Electronically Signed   By: Jeb Levering M.D.   On: 03/06/2015 21:30   Ct Head Wo Contrast  03/06/2015   CLINICAL DATA:  Unwitnessed fall.  Found on floor.  EXAM: CT HEAD WITHOUT CONTRAST  CT MAXILLOFACIAL WITHOUT CONTRAST  CT CERVICAL SPINE WITHOUT CONTRAST  TECHNIQUE: Multidetector CT imaging of the head, cervical spine, and maxillofacial structures were performed using the standard protocol without intravenous contrast. Multiplanar CT image reconstructions of the cervical spine and maxillofacial structures were also generated.   COMPARISON:  03/12/2014  FINDINGS: CT HEAD FINDINGS  There is a small volume blood collected dependently in the lateral ventricular occipital horns bilaterally. No other intracranial hemorrhage is evident. There is no extra-axial fluid collection. There is moderate generalized atrophy. There is moderate hemispheric white matter hypodensity consistent with chronic small vessel ischemic disease. The calvarium and skullbase are intact.  CT MAXILLOFACIAL FINDINGS  There is a mildly displaced right lateral orbital fracture. Orbital floor is intact. Medial orbital wall is intact. There is a right zygomatic arch fracture. There are mildly depressed fractures of the right anterior and posterior maxillary  sinus walls. There is a blood fluid level in the right maxillary sinus. The right maxillary sinus fracture line extends down into the right alveolar ridge, nondisplaced.  There is soft tissue or fluid occluding the left external auditory canal without evidence of adjacent fracture. This more likely is cerumen.  CT CERVICAL SPINE FINDINGS  The vertebral column, pedicles and facet articulations are intact. There is no evidence of acute fracture. No acute soft tissue abnormalities are evident.  Moderately severe degenerative changes are present, predominantly involving the facet articulations from C3 through C7. There is grade 1 degenerative-appearing anterolisthesis at C4-5 and C5-6.  IMPRESSION: 1. Very small volume intracranial hemorrhage collected dependently in the lateral ventricular occipital horns 2. Fractures of the right lateral orbit, right zygomatic arch, anterior and posterior right maxillary sinus walls. There is caudal extension from the right maxillary sinus fracture into the right alveolar ridge, nondisplaced. The orbital floor is intact. Pterygoid plates are intact. 3. Negative for acute cervical spine fracture. These results were called by telephone at the time of interpretation on 03/06/2015 at 10:05 pm to  Dr. Elwin Mocha , who verbally acknowledged these results.   Electronically Signed   By: Ellery Plunk M.D.   On: 03/06/2015 22:12   Ct Cervical Spine Wo Contrast  03/06/2015   CLINICAL DATA:  Unwitnessed fall.  Found on floor.  EXAM: CT HEAD WITHOUT CONTRAST  CT MAXILLOFACIAL WITHOUT CONTRAST  CT CERVICAL SPINE WITHOUT CONTRAST  TECHNIQUE: Multidetector CT imaging of the head, cervical spine, and maxillofacial structures were performed using the standard protocol without intravenous contrast. Multiplanar CT image reconstructions of the cervical spine and maxillofacial structures were also generated.  COMPARISON:  03/12/2014  FINDINGS: CT HEAD FINDINGS  There is a small volume blood collected dependently in the lateral ventricular occipital horns bilaterally. No other intracranial hemorrhage is evident. There is no extra-axial fluid collection. There is moderate generalized atrophy. There is moderate hemispheric white matter hypodensity consistent with chronic small vessel ischemic disease. The calvarium and skullbase are intact.  CT MAXILLOFACIAL FINDINGS  There is a mildly displaced right lateral orbital fracture. Orbital floor is intact. Medial orbital wall is intact. There is a right zygomatic arch fracture. There are mildly depressed fractures of the right anterior and posterior maxillary sinus walls. There is a blood fluid level in the right maxillary sinus. The right maxillary sinus fracture line extends down into the right alveolar ridge, nondisplaced.  There is soft tissue or fluid occluding the left external auditory canal without evidence of adjacent fracture. This more likely is cerumen.  CT CERVICAL SPINE FINDINGS  The vertebral column, pedicles and facet articulations are intact. There is no evidence of acute fracture. No acute soft tissue abnormalities are evident.  Moderately severe degenerative changes are present, predominantly involving the facet articulations from C3 through C7. There is  grade 1 degenerative-appearing anterolisthesis at C4-5 and C5-6.  IMPRESSION: 1. Very small volume intracranial hemorrhage collected dependently in the lateral ventricular occipital horns 2. Fractures of the right lateral orbit, right zygomatic arch, anterior and posterior right maxillary sinus walls. There is caudal extension from the right maxillary sinus fracture into the right alveolar ridge, nondisplaced. The orbital floor is intact. Pterygoid plates are intact. 3. Negative for acute cervical spine fracture. These results were called by telephone at the time of interpretation on 03/06/2015 at 10:05 pm to Dr. Elwin Mocha , who verbally acknowledged these results.   Electronically Signed   By: Rosey Bath.D.  On: 03/06/2015 22:12   Ct Maxillofacial Wo Cm  03/06/2015   CLINICAL DATA:  Unwitnessed fall.  Found on floor.  EXAM: CT HEAD WITHOUT CONTRAST  CT MAXILLOFACIAL WITHOUT CONTRAST  CT CERVICAL SPINE WITHOUT CONTRAST  TECHNIQUE: Multidetector CT imaging of the head, cervical spine, and maxillofacial structures were performed using the standard protocol without intravenous contrast. Multiplanar CT image reconstructions of the cervical spine and maxillofacial structures were also generated.  COMPARISON:  03/12/2014  FINDINGS: CT HEAD FINDINGS  There is a small volume blood collected dependently in the lateral ventricular occipital horns bilaterally. No other intracranial hemorrhage is evident. There is no extra-axial fluid collection. There is moderate generalized atrophy. There is moderate hemispheric white matter hypodensity consistent with chronic small vessel ischemic disease. The calvarium and skullbase are intact.  CT MAXILLOFACIAL FINDINGS  There is a mildly displaced right lateral orbital fracture. Orbital floor is intact. Medial orbital wall is intact. There is a right zygomatic arch fracture. There are mildly depressed fractures of the right anterior and posterior maxillary sinus walls.  There is a blood fluid level in the right maxillary sinus. The right maxillary sinus fracture line extends down into the right alveolar ridge, nondisplaced.  There is soft tissue or fluid occluding the left external auditory canal without evidence of adjacent fracture. This more likely is cerumen.  CT CERVICAL SPINE FINDINGS  The vertebral column, pedicles and facet articulations are intact. There is no evidence of acute fracture. No acute soft tissue abnormalities are evident.  Moderately severe degenerative changes are present, predominantly involving the facet articulations from C3 through C7. There is grade 1 degenerative-appearing anterolisthesis at C4-5 and C5-6.  IMPRESSION: 1. Very small volume intracranial hemorrhage collected dependently in the lateral ventricular occipital horns 2. Fractures of the right lateral orbit, right zygomatic arch, anterior and posterior right maxillary sinus walls. There is caudal extension from the right maxillary sinus fracture into the right alveolar ridge, nondisplaced. The orbital floor is intact. Pterygoid plates are intact. 3. Negative for acute cervical spine fracture. These results were called by telephone at the time of interpretation on 03/06/2015 at 10:05 pm to Dr. Evelina Bucy , who verbally acknowledged these results.   Electronically Signed   By: Andreas Newport M.D.   On: 03/06/2015 22:12   Review of Systems  Constitutional: Negative for fever and chills.  Cardiovascular: Negative for chest pain.  Gastrointestinal: Negative for abdominal pain.  Neurological: Positive for headaches.  All other systems reviewed and are negative.  Blood pressure 99/63, pulse 80, temperature 99.3 F (37.4 C), temperature source Axillary, resp. rate 20, SpO2 100 %.  Physical Exam  Constitutional: She appears thin and frail. Slightly confused. Head: Normocephalic.    Mouth/Throat: Oropharynx is clear and moist.  Eyes: EOM are normal. Pupils are equal, round, and  reactive to light.  Visual acuity could not be assessed. Ears: Normal auricles and EACs. Neck: Normal range of motion. Neck supple.  Pulmonary/Chest: Effort normal and breath sounds normal. No respiratory distress. She has no wheezes. She has no rales.  Neurological: She is alert.  Skin: No rash noted. She is not diaphoretic.   Assessment/Plan: Minimally displaced right tripod fractures. No obvious deformity. No acute intervention is needed at this time. Will consider surgical intervention if pt develops new visual symptoms such as diplopia or entrapment. Pt may follow up with me as an outpatient after discharge.  Jasten Guyette,SUI W 03/07/2015, 12:55 PM

## 2015-03-07 NOTE — Progress Notes (Signed)
Chaplain was referred by consult. Pt's daughter was by the bedside. Daughter said Pt would love prayer as she is very religious. Chaplain prayed with Pt and daughter. Chaplain will follow up as needed,   03/07/15 1000  Clinical Encounter Type  Visited With Patient and family together  Visit Type Spiritual support  Referral From Care management  Spiritual Encounters  Spiritual Needs Prayer;Emotional

## 2015-03-07 NOTE — Evaluation (Signed)
Clinical/Bedside Swallow Evaluation Patient Details  Name: Karla Phillips MRN: [id number] Date of Birth: 1921/10/24  Today's Date: 03/07/2015 Time: SLP Start Time (ACUTE ONLY): 1100 SLP Stop Time (ACUTE ONLY): 1128 SLP Time Calculation (min) (ACUTE ONLY): 28 min  Past Medical History:  Past Medical History  Diagnosis Date  . Hypertension   . Osteopenia   . GERD (gastroesophageal reflux disease)   . Memory loss   . Tremor   . Parkinson disease    Past Surgical History:  Past Surgical History  Procedure Laterality Date  . Eye surgery    . Breast surgery    . Tonsillectomy     HPI:  Karla Phillips is a [age] y.o. female with Past medical history of Parkinson's disease, gait difficulty, memory loss, GERD, hypertension.   Assessment / Plan / Recommendation Clinical Impression  Patient presents with a suspected baseline dysphagia (coughing primarily with liquids per daughter), now exacerbated by acute injury and AMS. Patient overall with a functional swallow without only occassional evidence of dysphagia with solid pos characterized by delayed oral transit of bolus and intermittent cough. Discussed with patient and daughter who wish for focus to remain on comfort with initiaton of pos with known risk of aspiration. Dysphagia 2 textures chosen to minimize risk and maximize ease of oral transit of bolus. Will advance diet. Education complete regarding safe swallowing precautions. No SLP f/u indicated.     Aspiration Risk  Moderate    Diet Recommendation Dysphagia 2 (Fine chop);Thin   Medication Administration: Crushed with puree Compensations: Slow rate;Small sips/bites    Other  Recommendations Oral Care Recommendations: Oral care BID   Follow Up Recommendations       Frequency and Duration        Pertinent Vitals/Pain n/a     Swallow Study    General Other Pertinent Information: Karla Phillips is a 81 y.o. female with Past medical history of Parkinson's disease, gait  difficulty, memory loss, GERD, hypertension. Type of Study: Bedside swallow evaluation Previous Swallow Assessment: none Diet Prior to this Study: NPO Temperature Spikes Noted: No Respiratory Status: Room air History of Recent Intubation: No Behavior/Cognition: Lethargic/Drowsy;Requires cueing Oral Cavity - Dentition: Poor condition;Missing dentition Self-Feeding Abilities: Able to feed self;Needs assist Patient Positioning: Upright in bed Baseline Vocal Quality: Low vocal intensity Volitional Cough: Weak Volitional Swallow: Able to elicit    Oral/Motor/Sensory Function Overall Oral Motor/Sensory Function: Appears within functional limits for tasks assessed (generalized weakness)   Ice Chips Ice chips: Within functional limits Presentation: Spoon   Thin Liquid Thin Liquid: Within functional limits Presentation: Cup;Self Fed;Straw    Nectar Thick Nectar Thick Liquid: Not tested   Honey Thick Honey Thick Liquid: Not tested   Puree Puree: Within functional limits Presentation: Spoon   Solid   GO   Karla Phillips, CCC-SLP [phone]  Solid: Impaired Oral Phase Impairments: Impaired anterior to posterior transit Pharyngeal Phase Impairments: Cough - Delayed       Karla Phillips 03/07/2015,12:17 PM

## 2015-03-07 NOTE — ED Notes (Signed)
Dr. Patel at bedside 

## 2015-03-07 NOTE — Clinical Social Work Note (Addendum)
Clinical Social Work Assessment  Patient Details  Name: Karla Phillips MRN: [id number] Date of Birth: 1922-02-11  Date of referral:  03/07/15               Reason for consult:  Facility Placement, Discharge Planning                Permission sought to share information with:  Case Manager, Customer service manager, Family Supports Permission granted to share information::  Yes, Verbal Permission Granted  Name::      Lorelle Formosa and Everlena Cooper )  Agency::   (Residential Hospice Facilities )  Relationship::   (Son)  Contact Information:   [phone])  Housing/Transportation Living arrangements for the past 2 months:  [place] of Information:  Adult Children Patient Interpreter Needed:  None Criminal Activity/Legal Involvement Pertinent to Current Situation/Hospitalization:  No - Comment as needed Significant Relationships:  Adult Children Lives with:  Adult Children Do you feel safe going back to the place where you live?  No Need for family participation in patient care:  Yes (Comment)  Care giving concerns:  No care giving concerns reported by family at this time.    Social Worker assessment / plan:  Holiday representative met with patient and patient's son and daughter present at bedside in reference to post-acute placement for residential hospice. CSW introduced CSW role and discharging planning. CSW reviewed and presented residential hospice list. Per family: pt is from home with adult grandchild with son Abe [employer] living directly next door. Pt's dtr reported she would like pt placed in the [facility] area and prefers placement at [facility] or [place] informed pt's family of ambulance out-of-pocket cost for transportation greater than 50 miles. Pt's son stated he is willing to pay out-of-pocket cost. CSW also presented SNF option with hospice care if pt is not appropriate for residential hospice. Pt's family agreeable to  second option as well. CSW has made referrals to [place] [facility] [place]. No further concerns reported at this time. CSW will continue to follow pt and pt's family for continued support and to facilitate pt's discharge needs once stable for transfer.   Employment status:  Retired Nurse, adult PT Recommendations:  Not assessed at this time Information / Referral to community resources:   ([place] )  Patient/Family's Response to care:  Pt lying in bed disoriented. Pt's family accepting of patient's prognosis and agreeable to hospice placement. Pt's family pleasant and appreciated social work intervention.   Patient/Family's Understanding of and Emotional Response to Diagnosis, Current Treatment, and Prognosis:  Pt's family has met with palliative care team/ attending MD and understanding of prognosis.   Emotional Assessment Appearance:  Appears stated age Attitude/Demeanor/Rapport:  Unable to Assess Affect (typically observed):  Unable to Assess Orientation:  Oriented to Self Alcohol / Substance use:  Never Used Psych involvement (Current and /or in the community):  No (Comment)  Discharge Needs  Concerns to be addressed:  No discharge needs identified Readmission within the last 30 days:  No Current discharge risk:  None Barriers to Discharge:  No Barriers Identified   Glendon Axe, MSW, LCSWA [phone] 03/07/2015 3:19 PM

## 2015-03-07 NOTE — H&P (Signed)
[facility] History and Physical  Patient: Karla Phillips  MRN: [id number]  DOB: March 17, 1922  DOS: the patient was seen and examined on 03/07/2015 PCP: Georgann Housekeeper, MD  Referring physician: Dr. Gwendolyn Grant Chief Complaint: Fall  HPI: Karla Phillips is a 81 y.o. female with Past medical history of Parkinson's disease, gait difficulty, memory loss, GERD, hypertension. The patient is presenting with complaints of multiple falls. The patient has an appointment with dentist fall today and has sustained injury to her face. She has early or fall as well and has multiple skin bruises at various stages of healing on her knee as well as on her hand. Patient's son who is the primary caretaker was not at bedside but his other brother was available provided that the patient has been progressively having declining mental status with declining health as well. Based on documentation available from February 2016 from neurology recommendation was for palliative care consult for goals of care as well as most forms. Patient son does not believe that she had any palliative care consult but they recently had a family meeting at which time he the other son as well as sister were discussing about her care and declining health and the patient responded that she does not want any heroic or aggressive measures and does not want to be kept alive artificially.  The patient is coming from home.  At her baseline ambulates with walker And is dependent for most of her ADL does not manages her medication on her own.  Review of Systems: as mentioned in the history of present illness.  A comprehensive review of the other systems is negative.  Past Medical History  Diagnosis Date  . Hypertension   . Osteopenia   . GERD (gastroesophageal reflux disease)   . Memory loss   . Tremor   . Parkinson disease    Past Surgical History  Procedure Laterality Date  . Eye surgery    . Breast surgery    . Tonsillectomy      Social History:  reports that she has never smoked. She does not have any smokeless tobacco history on file. She reports that she does not drink alcohol. Her drug history is not on file.  No Known Allergies  History reviewed. No pertinent family history.  Prior to Admission medications   Medication Sig Start Date End Date Taking? Authorizing Provider  carbidopa-levodopa (SINEMET IR) 25-100 MG per tablet Take 1 tablet by mouth 3 (three) times daily. Give 30 minutes before meals. 04/12/14  Yes Ronal Fear, NP  donepezil (ARICEPT) 10 MG tablet Take 10 mg by mouth at bedtime.   Yes Historical Provider, MD  hydrochlorothiazide (HYDRODIURIL) 25 MG tablet Take 25 mg by mouth daily.  04/03/13  Yes Historical Provider, MD  LORazepam (ATIVAN) 0.5 MG tablet Take 0.5 mg by mouth daily.    Yes Historical Provider, MD  meclizine (ANTIVERT) 12.5 MG tablet Take 12.5 mg by mouth daily as needed for dizziness. PRN   Yes Historical Provider, MD  ranitidine (ZANTAC) 150 MG tablet Take 150 mg by mouth 2 (two) times daily.  04/03/13  Yes Historical Provider, MD    Physical Exam: Filed Vitals:   03/07/15 0145 03/07/15 0200 03/07/15 0215 03/07/15 0318  BP: 209/179 209/182 215/200 107/59  Pulse:   79 70  Temp:    98.4 F (36.9 C)  TempSrc:    Oral  Resp: 17 15 19 18   SpO2:   93% 100%    General: Patient was seen in  the bed with eyes closed and will follow command if her son talked to her. Appear in mild distress Eyes: Bruising noted on the upper eyelid on the right side. Tenderness noted as well. Unable to examine pupil appropriately due to lack of cooperation from the patient ENT: Oral Mucosa dried blood dry. Neck: Difficult to assess JVD Cardiovascular: S1 and S2 Present, aortic systolic Murmur, Peripheral Pulses Present Respiratory: Bilateral Air entry equal and Decreased,  Clear to Auscultation, no Crackles, no wheezes Abdomen: Bowel Sound present, Soft and non- tender Skin: Multiple bruises  Rash Extremities: No Pedal edema, no calf tenderness Neurologic: Mental status response to verbal command from son, Cranial Nerves cough reflex present, Motor strength spontaneously moving all extremities, Sensation withdraws to painful stimuli, reflexes knee reflex present, babinski negative, Proprioception and Cerebellar test difficult to assess.  Labs on Admission:  CBC:  Recent Labs Lab 03/06/15 2103  WBC 6.9  HGB 12.4  HCT 38.7  MCV 93.5  PLT 179    CMP     Component Value Date/Time   NA 141 03/06/2015 2103   K 3.5 03/06/2015 2103   CL 103 03/06/2015 2103   CO2 29 03/06/2015 2103   GLUCOSE 146* 03/06/2015 2103   BUN 26* 03/06/2015 2103   CREATININE 0.75 03/06/2015 2103   CALCIUM 9.6 03/06/2015 2103   GFRNONAA >60 03/06/2015 2103   GFRAA >60 03/06/2015 2103    No results for input(s): LIPASE, AMYLASE in the last 168 hours.  No results for input(s): CKTOTAL, CKMB, CKMBINDEX, TROPONINI in the last 168 hours. BNP (last 3 results) No results for input(s): BNP in the last 8760 hours.  ProBNP (last 3 results)  Recent Labs  03/12/14 2221  PROBNP 197.7     Radiological Exams on Admission: Dg Chest 2 View  03/06/2015   CLINICAL DATA:  Fall earlier this day.  EXAM: CHEST  2 VIEW  COMPARISON:  03/12/2014  FINDINGS: Lower lung volumes from prior exam. Cardiomediastinal contours are unchanged, heart remains at the upper limits of normal in size. Dependent bibasilar opacities, likely atelectasis, best appreciated on the lateral view. No confluent airspace disease to suggest pneumonia. No pulmonary edema, pleural effusion, or pneumothorax. There is exaggerated thoracic kyphosis, no evidence of acute osseous abnormality.  IMPRESSION: Hypoventilatory chest with bibasilar atelectasis.   Electronically Signed   By: Rubye Oaks M.D.   On: 03/06/2015 21:30   Ct Head Wo Contrast  03/06/2015   CLINICAL DATA:  Unwitnessed fall.  Found on floor.  EXAM: CT HEAD WITHOUT CONTRAST  CT  MAXILLOFACIAL WITHOUT CONTRAST  CT CERVICAL SPINE WITHOUT CONTRAST  TECHNIQUE: Multidetector CT imaging of the head, cervical spine, and maxillofacial structures were performed using the standard protocol without intravenous contrast. Multiplanar CT image reconstructions of the cervical spine and maxillofacial structures were also generated.  COMPARISON:  03/12/2014  FINDINGS: CT HEAD FINDINGS  There is a small volume blood collected dependently in the lateral ventricular occipital horns bilaterally. No other intracranial hemorrhage is evident. There is no extra-axial fluid collection. There is moderate generalized atrophy. There is moderate hemispheric white matter hypodensity consistent with chronic small vessel ischemic disease. The calvarium and skullbase are intact.  CT MAXILLOFACIAL FINDINGS  There is a mildly displaced right lateral orbital fracture. Orbital floor is intact. Medial orbital wall is intact. There is a right zygomatic arch fracture. There are mildly depressed fractures of the right anterior and posterior maxillary sinus walls. There is a blood fluid level in the right maxillary sinus.  The right maxillary sinus fracture line extends down into the right alveolar ridge, nondisplaced.  There is soft tissue or fluid occluding the left external auditory canal without evidence of adjacent fracture. This more likely is cerumen.  CT CERVICAL SPINE FINDINGS  The vertebral column, pedicles and facet articulations are intact. There is no evidence of acute fracture. No acute soft tissue abnormalities are evident.  Moderately severe degenerative changes are present, predominantly involving the facet articulations from C3 through C7. There is grade 1 degenerative-appearing anterolisthesis at C4-5 and C5-6.  IMPRESSION: 1. Very small volume intracranial hemorrhage collected dependently in the lateral ventricular occipital horns 2. Fractures of the right lateral orbit, right zygomatic arch, anterior and  posterior right maxillary sinus walls. There is caudal extension from the right maxillary sinus fracture into the right alveolar ridge, nondisplaced. The orbital floor is intact. Pterygoid plates are intact. 3. Negative for acute cervical spine fracture. These results were called by telephone at the time of interpretation on 03/06/2015 at 10:05 pm to Dr. Elwin Mocha , who verbally acknowledged these results.   Electronically Signed   By: Ellery Plunk M.D.   On: 03/06/2015 22:12   Ct Cervical Spine Wo Contrast  03/06/2015   CLINICAL DATA:  Unwitnessed fall.  Found on floor.  EXAM: CT HEAD WITHOUT CONTRAST  CT MAXILLOFACIAL WITHOUT CONTRAST  CT CERVICAL SPINE WITHOUT CONTRAST  TECHNIQUE: Multidetector CT imaging of the head, cervical spine, and maxillofacial structures were performed using the standard protocol without intravenous contrast. Multiplanar CT image reconstructions of the cervical spine and maxillofacial structures were also generated.  COMPARISON:  03/12/2014  FINDINGS: CT HEAD FINDINGS  There is a small volume blood collected dependently in the lateral ventricular occipital horns bilaterally. No other intracranial hemorrhage is evident. There is no extra-axial fluid collection. There is moderate generalized atrophy. There is moderate hemispheric white matter hypodensity consistent with chronic small vessel ischemic disease. The calvarium and skullbase are intact.  CT MAXILLOFACIAL FINDINGS  There is a mildly displaced right lateral orbital fracture. Orbital floor is intact. Medial orbital wall is intact. There is a right zygomatic arch fracture. There are mildly depressed fractures of the right anterior and posterior maxillary sinus walls. There is a blood fluid level in the right maxillary sinus. The right maxillary sinus fracture line extends down into the right alveolar ridge, nondisplaced.  There is soft tissue or fluid occluding the left external auditory canal without evidence of adjacent  fracture. This more likely is cerumen.  CT CERVICAL SPINE FINDINGS  The vertebral column, pedicles and facet articulations are intact. There is no evidence of acute fracture. No acute soft tissue abnormalities are evident.  Moderately severe degenerative changes are present, predominantly involving the facet articulations from C3 through C7. There is grade 1 degenerative-appearing anterolisthesis at C4-5 and C5-6.  IMPRESSION: 1. Very small volume intracranial hemorrhage collected dependently in the lateral ventricular occipital horns 2. Fractures of the right lateral orbit, right zygomatic arch, anterior and posterior right maxillary sinus walls. There is caudal extension from the right maxillary sinus fracture into the right alveolar ridge, nondisplaced. The orbital floor is intact. Pterygoid plates are intact. 3. Negative for acute cervical spine fracture. These results were called by telephone at the time of interpretation on 03/06/2015 at 10:05 pm to Dr. Elwin Mocha , who verbally acknowledged these results.   Electronically Signed   By: Ellery Plunk M.D.   On: 03/06/2015 22:12   Ct Maxillofacial Wo Cm  03/06/2015  CLINICAL DATA:  Unwitnessed fall.  Found on floor.  EXAM: CT HEAD WITHOUT CONTRAST  CT MAXILLOFACIAL WITHOUT CONTRAST  CT CERVICAL SPINE WITHOUT CONTRAST  TECHNIQUE: Multidetector CT imaging of the head, cervical spine, and maxillofacial structures were performed using the standard protocol without intravenous contrast. Multiplanar CT image reconstructions of the cervical spine and maxillofacial structures were also generated.  COMPARISON:  03/12/2014  FINDINGS: CT HEAD FINDINGS  There is a small volume blood collected dependently in the lateral ventricular occipital horns bilaterally. No other intracranial hemorrhage is evident. There is no extra-axial fluid collection. There is moderate generalized atrophy. There is moderate hemispheric white matter hypodensity consistent with chronic  small vessel ischemic disease. The calvarium and skullbase are intact.  CT MAXILLOFACIAL FINDINGS  There is a mildly displaced right lateral orbital fracture. Orbital floor is intact. Medial orbital wall is intact. There is a right zygomatic arch fracture. There are mildly depressed fractures of the right anterior and posterior maxillary sinus walls. There is a blood fluid level in the right maxillary sinus. The right maxillary sinus fracture line extends down into the right alveolar ridge, nondisplaced.  There is soft tissue or fluid occluding the left external auditory canal without evidence of adjacent fracture. This more likely is cerumen.  CT CERVICAL SPINE FINDINGS  The vertebral column, pedicles and facet articulations are intact. There is no evidence of acute fracture. No acute soft tissue abnormalities are evident.  Moderately severe degenerative changes are present, predominantly involving the facet articulations from C3 through C7. There is grade 1 degenerative-appearing anterolisthesis at C4-5 and C5-6.  IMPRESSION: 1. Very small volume intracranial hemorrhage collected dependently in the lateral ventricular occipital horns 2. Fractures of the right lateral orbit, right zygomatic arch, anterior and posterior right maxillary sinus walls. There is caudal extension from the right maxillary sinus fracture into the right alveolar ridge, nondisplaced. The orbital floor is intact. Pterygoid plates are intact. 3. Negative for acute cervical spine fracture. These results were called by telephone at the time of interpretation on 03/06/2015 at 10:05 pm to Dr. Elwin Mocha , who verbally acknowledged these results.   Electronically Signed   By: Ellery Plunk M.D.   On: 03/06/2015 22:12   EKG: Independently reviewed. normal sinus rhythm, nonspecific ST and T waves changes.  Assessment/Plan Principal Problem:   Intracranial hemorrhage following injury Active Problems:   Parkinson disease   Closed fracture  of facial bones   Accelerated hypertension   1. Intracranial hemorrhage following injury The patient is presenting with a fall. She has sustained significant injury involving multiple systems. Neurosurgery was consulted over phone Dr. Rolanda Jay, who does not feel to the patient is any need for surgical intervention. ENT surgeon will consult on the patient but does not feel the patient needs any active surgical intervention or antibiotics. Trauma surgery was also consulted for the patient who will follow up on the patient but does not feel that the patient will need any surgical intervention or benefit from.  At her baseline the patient is able to communicate her needs and identify her family members. With this findings and accelerated hypertension with intracranial hemorrhage with progressively declining status and discuss patient's prognosis any aggressive intervention secondary to her age and Parkinson's disease with memory issue. Family mentions that as her mother has requested no heroic measures that would like the patient to be DNR/DNI. Secondary to recent fall and progressively declining health condition the family would prefer the patient to be kept comfortable with symptomatically  management only and no aggressive treatment. With this discussion the patient was placed on med surge unit. When necessary anxiety medication as well as when necessary pain medications are ordered.  2. Accelerated hypertension. At the time of my evaluation the patient's blood pressure significantly elevated to 220-260 systolic. I ordered hydralazine despite which her blood pressure continues to remain elevated and therefore I ordered clonidine patch for her to long-term control blood pressure.  3. Parkinson's disease. Based on neurology documentation on February 2016 patient has significantly declining status of her disease and they requested palliative care consult to discuss goals of care. At present I  will continue her Sinemet at home doses when she is able to tolerate orally. We will consult palliative care in the morning.  Advance goals of care discussion: DNR/DNI as per my discussion with the patient's son Mr. Vickey Sages, Mr. Neyens mentions that his decision is reflective of a combined family discussion that they had recently with his sister as well as brother.  Consults: ED discussed with neurosurgery, ENT, trauma surgery.  DVT Prophylaxis: mechanical compression device Nutrition: Advance as tolerated  Family Communication: family was present at bedside, opportunity was given to ask question and all questions were answered satisfactorily at the time of interview. Disposition: Admitted as inpatient, med-surge unit.  Author: Lynden Oxford, MD [facility] Pager: [phone] 03/07/2015  If 7PM-7AM, please contact night-coverage [email or web address] Password [email or web address]

## 2015-03-07 NOTE — Consult Note (Signed)
Consultation Note Date: 03/07/2015   Patient Name: Karla Phillips  DOB: 06-30-1922  MRN: [id number]  Age / Sex: 81 y.o., female   PCP: Wenda Low, MD Referring Physician: Nita Sells, MD  Reason for Consultation: Disposition  Palliative Care Assessment and Plan Summary of Established Goals of Care and Medical Treatment Preferences   Clinical Assessment/Narrative: 81 yo female with PD, HTN who suffered fall with facial fractures and small ICH.    Contacts/Participants in Discussion: Primary Decision Maker: family   HCPOA: none documented, but family cohesive in decision making   Spoke today with son Mortimer Fries over phone and met with daughter Malachy Mood.  Mortimer Fries was with patient overnight and instructed me to talk to Malachy Mood and family is in agreement with following Cheryl's wishes.  Ultimately, Jinger has been doing poorly for months and having functional and cognitive decline. Reportedly has living will stating she did not want aggressive measures at end of life, and especially pointed out that she never wanted her ability to eat orally taken away per [place].  Given these factors family wishes to focus on comfort.  They also feel like they are no longer able to take care of her at home.  Challenge in this situation is that I suspect unless she has acute setback such as evidence of progressive ICH, infection, she will not qualify for hospice facility (ultimately this determination is not made by palliative team and is up to individual hospice agency).  We talked about potential skilled nursing facility placement with palliative following or hospice if going to SNF for long term care.  Family in agreement with this plan. Certainly can switch to hospice facility if setback here in interim which she is at risk for.  We filled out MOST form to reflect wishes of DNR, comfort care, no repeat hospitalization, no artifical hydration/feeding and no abx. Focus is comfort  Our team will shadow  peripherally but please contact us if additional questions or concerns arise.  Code Status/Advance Care Planning:  DNR  Symptom Management:   Dysphagia- Allow feeds for comfort. Family wishes her to have whatever she desires.  Pain- continue PRNs   Psycho-social/Spiritual:   Support System: lives with son Abe People.  Widowed.    Desire for further Chaplaincy support:no  Prognosis: suspect weeks to months unless acute setback as above  Discharge Planning:  SNF       Chief Complaint: Fall  History of Present Illness:  81 yo female with Parkinsons (gait instability, memory, tremors), HTN, multiple falls who was admitted overnight after having fall at dentist office yesterday.  She sustained facial injury with fractures as noted on CT as well as small intracranial hemorrhage. She is not felt to be a good surgical candidate.  Hospitalist team spoke with family about events who indicated that they wished to pursue comfort care because of declining quality of life. Palliative care team has been consulted for assistance.  Patient is confused but can answer simple questions. Denies any pain, dyspnea or other acute complaints.   Primary Diagnoses  Present on Admission:  . Intracranial hemorrhage following injury . Closed fracture of facial bones . Parkinson disease . Accelerated hypertension   I have reviewed the medical record, interviewed the patient and family, and examined the patient. The following aspects are pertinent.  Past Medical History  Diagnosis Date  . Hypertension   . Osteopenia   . GERD (gastroesophageal reflux disease)   . Memory loss   . Tremor   . Parkinson disease  History   Social History  . Marital Status: Widowed    Spouse Name: N/A  . Number of Children: 5  . Years of Education: 12   Occupational History  . retired    Social History Main Topics  . Smoking status: Never Smoker   . Smokeless tobacco: Not on file  . Alcohol Use: No  . Drug  Use: Not on file  . Sexual Activity: Not on file   Other Topics Concern  . None   Social History Narrative   She is retired, used to do office work. She has a high school education. She is widowed and lives alone, but her son lives immediately behind her. She denies tobacco, alcohol, illicit drug use.   History reviewed. No pertinent family history. Scheduled Meds: . carbidopa-levodopa  1 tablet Oral TID WC   Continuous Infusions:  PRN Meds:.acetaminophen **OR** acetaminophen, fentaNYL (SUBLIMAZE) injection, LORazepam **OR** [DISCONTINUED] LORazepam **OR** LORazepam, ondansetron **OR** ondansetron (ZOFRAN) IV Medications Prior to Admission:  Prior to Admission medications   Medication Sig Start Date End Date Taking? Authorizing Provider  carbidopa-levodopa (SINEMET IR) 25-100 MG per tablet Take 1 tablet by mouth 3 (three) times daily. Give 30 minutes before meals. 04/12/14  Yes Philmore Pali, NP  donepezil (ARICEPT) 10 MG tablet Take 10 mg by mouth at bedtime.   Yes Historical Provider, MD  hydrochlorothiazide (HYDRODIURIL) 25 MG tablet Take 25 mg by mouth daily.  04/03/13  Yes Historical Provider, MD  LORazepam (ATIVAN) 0.5 MG tablet Take 0.5 mg by mouth daily.    Yes Historical Provider, MD  meclizine (ANTIVERT) 12.5 MG tablet Take 12.5 mg by mouth daily as needed for dizziness. PRN   Yes Historical Provider, MD  ranitidine (ZANTAC) 150 MG tablet Take 150 mg by mouth 2 (two) times daily.  04/03/13  Yes Historical Provider, MD   No Known Allergies CBC:    Component Value Date/Time   WBC 6.9 03/06/2015 2103   HGB 12.4 03/06/2015 2103   HCT 38.7 03/06/2015 2103   PLT 179 03/06/2015 2103   MCV 93.5 03/06/2015 2103   Comprehensive Metabolic Panel:    Component Value Date/Time   NA 141 03/06/2015 2103   K 3.5 03/06/2015 2103   CL 103 03/06/2015 2103   CO2 29 03/06/2015 2103   BUN 26* 03/06/2015 2103   CREATININE 0.75 03/06/2015 2103   GLUCOSE 146* 03/06/2015 2103   CALCIUM 9.6  03/06/2015 2103    Physical Exam: Vital Signs: BP 99/63 mmHg  Pulse 80  Temp(Src) 99.3 F (37.4 C) (Axillary)  Resp 20  SpO2 100% SpO2: SpO2: 100 % O2 Device: O2 Device: Not Delivered O2 Flow Rate:   Intake/output summary: No intake or output data in the 24 hours ending 03/07/15 1146 LBM: Last BM Date: 03/07/15 Baseline Weight:   Most recent weight:    Exam Findings:  GEN: alert, NAD, facial contusions/bruisng CV: reg rate LUNGS: CTAB ABD: soft, ND EXT: warm         Palliative Performance Scale: 40             Additional Data Reviewed: Recent Labs     03/06/15  2103  WBC  6.9  HGB  12.4  PLT  179  NA  141  BUN  26*  CREATININE  0.75      Time Total: 50 minutes Greater than 50%  of this time was spent counseling and coordinating care related to the above assessment and plan.  Signed by: Timmie Foerster  JOHN, DO  Doran Clay, DO  03/07/2015, 11:46 AM  Please contact Palliative Medicine Team phone at [phone] for questions and concerns.

## 2015-03-07 NOTE — ED Notes (Signed)
Son [phone]

## 2015-03-08 MED ORDER — WHITE PETROLATUM GEL
Status: AC
  Filled 2015-03-08: qty 1

## 2015-03-08 MED ORDER — LORAZEPAM 2 MG/ML PO CONC: 1.0000 mg | ORAL | Status: AC | PRN

## 2015-03-08 MED ORDER — MORPHINE SULFATE (CONCENTRATE) 10 MG /0.5 ML PO SOLN
5.0000 mg | ORAL | Status: AC | PRN
Start: 2015-03-08 — End: ?

## 2015-03-08 NOTE — Discharge Summary (Addendum)
Physician Discharge Summary  Karla Phillips ZOX:[id number] DOB: 06-11-1922 DOA: 03/06/2015  PCP: Georgann Housekeeper, MD  Admit date: 03/06/2015 Discharge date: 03/08/2015  Time spent: 35 minutes  Recommendations for Outpatient Follow-up:  1. recommend Palliative care follow at skilled facility 2. Multiple meds d/c this admit as patient transitioned to comfort care 3. D/c to [facility] place Skilled facility    Discharge Diagnoses:  Principal Problem:   Intracerebral hemorrhage, intraventricular Active Problems:   Parkinson disease   Intracranial hemorrhage following injury   Closed fracture of facial bones   Accelerated hypertension   Intraventricular hemorrhage   Right maxillary fracture   Palliative care encounter   Discharge Condition: poor overall prognosis  Diet recommendation: comfort feeds  There were no vitals filed for this visit.  History of present illness:  81 y/o ? multiple recent falls, Parkinson's since ?06/2008, hallucinations and Adult Failure to thrive admitted early am 03/07/15 withIC hemorrhage following a fall NS and ENT consulted and recommending no aggressive measures ion admission given her poor overall functional state PAtient was also seen by pallaitve care and her clinical course was delineated to a full comort trajectory I have only continued her Sinement and have added Ativan SL and Roxanol Her prognosis overall is poor but she is still coherent and alert to some extent   Discharge Exam: Filed Vitals:   03/07/15 2000  BP:   Pulse:   Temp:   Resp: 18    General: sleepy but rousable racooning of R eye Knows place and person Cardiovascular: s1 s 2no m/r/g Respiratory: clear  Discharge Instructions   Discharge Instructions    Diet - low sodium heart healthy    Complete by:  As directed      Increase activity slowly    Complete by:  As directed           Current Discharge Medication List    START taking these medications    Details  LORazepam (ATIVAN) 2 MG/ML concentrated solution Take 0.5 mLs (1 mg total) by mouth every 4 (four) hours as needed for anxiety. Qty: 30 mL, Refills: 0    Morphine Sulfate (MORPHINE CONCENTRATE) 10 mg / 0.5 ml concentrated solution Take 0.25-0.5 mLs (5-10 mg total) by mouth every 2 (two) hours as needed for severe pain. Qty: 120 mL, Refills: 0      CONTINUE these medications which have NOT CHANGED   Details  carbidopa-levodopa (SINEMET IR) 25-100 MG per tablet Take 1 tablet by mouth 3 (three) times daily. Give 30 minutes before meals. Qty: 90 tablet, Refills: 3    LORazepam (ATIVAN) 0.5 MG tablet Take 0.5 mg by mouth every 4 (four) hours as needed for anxiety.       STOP taking these medications     hydrochlorothiazide (HYDRODIURIL) 25 MG tablet      ranitidine (ZANTAC) 150 MG tablet        No Known Allergies    The results of significant diagnostics from this hospitalization (including imaging, microbiology, ancillary and laboratory) are listed below for reference.    Significant Diagnostic Studies: Dg Chest 2 View  03/06/2015   CLINICAL DATA:  Fall earlier this day.  EXAM: CHEST  2 VIEW  COMPARISON:  03/12/2014  FINDINGS: Lower lung volumes from prior exam. Cardiomediastinal contours are unchanged, heart remains at the upper limits of normal in size. Dependent bibasilar opacities, likely atelectasis, best appreciated on the lateral view. No confluent airspace disease to suggest pneumonia. No pulmonary edema, pleural effusion, or pneumothorax. There  is exaggerated thoracic kyphosis, no evidence of acute osseous abnormality.  IMPRESSION: Hypoventilatory chest with bibasilar atelectasis.   Electronically Signed   By: Rubye Oaks M.D.   On: 03/06/2015 21:30   Ct Head Wo Contrast  03/06/2015   CLINICAL DATA:  Unwitnessed fall.  Found on floor.  EXAM: CT HEAD WITHOUT CONTRAST  CT MAXILLOFACIAL WITHOUT CONTRAST  CT CERVICAL SPINE WITHOUT CONTRAST  TECHNIQUE:  Multidetector CT imaging of the head, cervical spine, and maxillofacial structures were performed using the standard protocol without intravenous contrast. Multiplanar CT image reconstructions of the cervical spine and maxillofacial structures were also generated.  COMPARISON:  03/12/2014  FINDINGS: CT HEAD FINDINGS  There is a small volume blood collected dependently in the lateral ventricular occipital horns bilaterally. No other intracranial hemorrhage is evident. There is no extra-axial fluid collection. There is moderate generalized atrophy. There is moderate hemispheric white matter hypodensity consistent with chronic small vessel ischemic disease. The calvarium and skullbase are intact.  CT MAXILLOFACIAL FINDINGS  There is a mildly displaced right lateral orbital fracture. Orbital floor is intact. Medial orbital wall is intact. There is a right zygomatic arch fracture. There are mildly depressed fractures of the right anterior and posterior maxillary sinus walls. There is a blood fluid level in the right maxillary sinus. The right maxillary sinus fracture line extends down into the right alveolar ridge, nondisplaced.  There is soft tissue or fluid occluding the left external auditory canal without evidence of adjacent fracture. This more likely is cerumen.  CT CERVICAL SPINE FINDINGS  The vertebral column, pedicles and facet articulations are intact. There is no evidence of acute fracture. No acute soft tissue abnormalities are evident.  Moderately severe degenerative changes are present, predominantly involving the facet articulations from C3 through C7. There is grade 1 degenerative-appearing anterolisthesis at C4-5 and C5-6.  IMPRESSION: 1. Very small volume intracranial hemorrhage collected dependently in the lateral ventricular occipital horns 2. Fractures of the right lateral orbit, right zygomatic arch, anterior and posterior right maxillary sinus walls. There is caudal extension from the right  maxillary sinus fracture into the right alveolar ridge, nondisplaced. The orbital floor is intact. Pterygoid plates are intact. 3. Negative for acute cervical spine fracture. These results were called by telephone at the time of interpretation on 03/06/2015 at 10:05 pm to Dr. Elwin Mocha , who verbally acknowledged these results.   Electronically Signed   By: Ellery Plunk M.D.   On: 03/06/2015 22:12   Ct Cervical Spine Wo Contrast  03/06/2015   CLINICAL DATA:  Unwitnessed fall.  Found on floor.  EXAM: CT HEAD WITHOUT CONTRAST  CT MAXILLOFACIAL WITHOUT CONTRAST  CT CERVICAL SPINE WITHOUT CONTRAST  TECHNIQUE: Multidetector CT imaging of the head, cervical spine, and maxillofacial structures were performed using the standard protocol without intravenous contrast. Multiplanar CT image reconstructions of the cervical spine and maxillofacial structures were also generated.  COMPARISON:  03/12/2014  FINDINGS: CT HEAD FINDINGS  There is a small volume blood collected dependently in the lateral ventricular occipital horns bilaterally. No other intracranial hemorrhage is evident. There is no extra-axial fluid collection. There is moderate generalized atrophy. There is moderate hemispheric white matter hypodensity consistent with chronic small vessel ischemic disease. The calvarium and skullbase are intact.  CT MAXILLOFACIAL FINDINGS  There is a mildly displaced right lateral orbital fracture. Orbital floor is intact. Medial orbital wall is intact. There is a right zygomatic arch fracture. There are mildly depressed fractures of the right anterior and posterior maxillary  sinus walls. There is a blood fluid level in the right maxillary sinus. The right maxillary sinus fracture line extends down into the right alveolar ridge, nondisplaced.  There is soft tissue or fluid occluding the left external auditory canal without evidence of adjacent fracture. This more likely is cerumen.  CT CERVICAL SPINE FINDINGS  The vertebral  column, pedicles and facet articulations are intact. There is no evidence of acute fracture. No acute soft tissue abnormalities are evident.  Moderately severe degenerative changes are present, predominantly involving the facet articulations from C3 through C7. There is grade 1 degenerative-appearing anterolisthesis at C4-5 and C5-6.  IMPRESSION: 1. Very small volume intracranial hemorrhage collected dependently in the lateral ventricular occipital horns 2. Fractures of the right lateral orbit, right zygomatic arch, anterior and posterior right maxillary sinus walls. There is caudal extension from the right maxillary sinus fracture into the right alveolar ridge, nondisplaced. The orbital floor is intact. Pterygoid plates are intact. 3. Negative for acute cervical spine fracture. These results were called by telephone at the time of interpretation on 03/06/2015 at 10:05 pm to Dr. Elwin MochaBLAIR WALDEN , who verbally acknowledged these results.   Electronically Signed   By: Ellery Plunkaniel R Mitchell M.D.   On: 03/06/2015 22:12   Ct Maxillofacial Wo Cm  03/06/2015   CLINICAL DATA:  Unwitnessed fall.  Found on floor.  EXAM: CT HEAD WITHOUT CONTRAST  CT MAXILLOFACIAL WITHOUT CONTRAST  CT CERVICAL SPINE WITHOUT CONTRAST  TECHNIQUE: Multidetector CT imaging of the head, cervical spine, and maxillofacial structures were performed using the standard protocol without intravenous contrast. Multiplanar CT image reconstructions of the cervical spine and maxillofacial structures were also generated.  COMPARISON:  03/12/2014  FINDINGS: CT HEAD FINDINGS  There is a small volume blood collected dependently in the lateral ventricular occipital horns bilaterally. No other intracranial hemorrhage is evident. There is no extra-axial fluid collection. There is moderate generalized atrophy. There is moderate hemispheric white matter hypodensity consistent with chronic small vessel ischemic disease. The calvarium and skullbase are intact.  CT  MAXILLOFACIAL FINDINGS  There is a mildly displaced right lateral orbital fracture. Orbital floor is intact. Medial orbital wall is intact. There is a right zygomatic arch fracture. There are mildly depressed fractures of the right anterior and posterior maxillary sinus walls. There is a blood fluid level in the right maxillary sinus. The right maxillary sinus fracture line extends down into the right alveolar ridge, nondisplaced.  There is soft tissue or fluid occluding the left external auditory canal without evidence of adjacent fracture. This more likely is cerumen.  CT CERVICAL SPINE FINDINGS  The vertebral column, pedicles and facet articulations are intact. There is no evidence of acute fracture. No acute soft tissue abnormalities are evident.  Moderately severe degenerative changes are present, predominantly involving the facet articulations from C3 through C7. There is grade 1 degenerative-appearing anterolisthesis at C4-5 and C5-6.  IMPRESSION: 1. Very small volume intracranial hemorrhage collected dependently in the lateral ventricular occipital horns 2. Fractures of the right lateral orbit, right zygomatic arch, anterior and posterior right maxillary sinus walls. There is caudal extension from the right maxillary sinus fracture into the right alveolar ridge, nondisplaced. The orbital floor is intact. Pterygoid plates are intact. 3. Negative for acute cervical spine fracture. These results were called by telephone at the time of interpretation on 03/06/2015 at 10:05 pm to Dr. Elwin MochaBLAIR WALDEN , who verbally acknowledged these results.   Electronically Signed   By: Ellery Plunkaniel R Mitchell M.D.   On:  03/06/2015 22:12    Microbiology: No results found for this or any previous visit (from the past 240 hour(s)).   Labs: Basic Metabolic Panel:  Recent Labs Lab 03/06/15 2103  NA 141  K 3.5  CL 103  CO2 29  GLUCOSE 146*  BUN 26*  CREATININE 0.75  CALCIUM 9.6   Liver Function Tests: No results for  input(s): AST, ALT, ALKPHOS, BILITOT, PROT, ALBUMIN in the last 168 hours. No results for input(s): LIPASE, AMYLASE in the last 168 hours. No results for input(s): AMMONIA in the last 168 hours. CBC:  Recent Labs Lab 03/06/15 2103  WBC 6.9  HGB 12.4  HCT 38.7  MCV 93.5  PLT 179   Cardiac Enzymes: No results for input(s): CKTOTAL, CKMB, CKMBINDEX, TROPONINI in the last 168 hours. BNP: BNP (last 3 results) No results for input(s): BNP in the last 8760 hours.  ProBNP (last 3 results)  Recent Labs  03/12/14 2221  PROBNP 197.7    CBG: No results for input(s): GLUCAP in the last 168 hours.     SignedRhetta Mura  [facility] 03/08/2015, 10:42 AM

## 2015-03-08 NOTE — Clinical Social Work Note (Signed)
Clinical Social Worker has met with patient's daughter, Malachy Mood several times throughout the day in reference to SNF placement. Patient does NOT meet requirements for residential hospice placement.   [employer] is currently requesting peer-to-peer with MD. Case has been discussed with CSW AD and LOG has been denied. Patient's dtr stated family is unable to pay privately. Family prepared to take patient home if needed however strongly desires for patient to be placement within a facility due to family working during the day.  CSW has discussed several discharge options including home health care and home with hospice.  CSW has presented bed offers to patient's dtr. Pt's dtr plans to review bed offers and keep family updated.   MD notified of all of the above and plans to have peer-to-peer with [facility].   CSW remains available.   Glendon Axe, MSW, LCSWA [phone] 03/08/2015 3:57 PM

## 2015-03-09 DIAGNOSIS — I615 Nontraumatic intracerebral hemorrhage, intraventricular: Secondary | ICD-10-CM

## 2015-03-09 NOTE — Care Management (Signed)
Important Message  Patient Details  Name: Karla Phillips MRN: [id number] Date of Birth: 30-Jun-1922   Medicare Important Message Given:  Yes-second notification given    Rayvon CharSTUTTS, Brigitta Pricer G 03/09/2015, 2:45 PM

## 2015-03-09 NOTE — Evaluation (Signed)
Physical Therapy Evaluation Patient Details Name: Karla Phillips MRN: [id number] DOB: July 06, 1922 Today's Date: 03/09/2015   History of Present Illness  81 y.o. female with a history of Parkinson's disease, gait difficulty, memory loss, GERD, and hypertension, who was transported to the [facility] ER yesterday for evaluation and treatment after a fall.  Clinical Impression  Patient demonstrates deficits in functional mobility as indicated below. May benefit from continued skilled PT to address deficits and decreased BOC. Patient was ambulatory with RW and assist prior to fall.     Follow Up Recommendations SNF;Supervision/Assistance - 24 hour    Equipment Recommendations  None recommended by PT    Recommendations for Other Services       Precautions / Restrictions Precautions Precautions: Fall Restrictions Weight Bearing Restrictions: No      Mobility  Bed Mobility Overal bed mobility: Needs Assistance Bed Mobility: Supine to Sit;Sit to Supine     Supine to sit: Max assist Sit to supine: Max assist      Transfers Overall transfer level: Needs assistance Equipment used: 2 person hand held assist Transfers: Sit to/from Stand Sit to Stand: Max assist            Ambulation/Gait                Stairs            Wheelchair Mobility    Modified Rankin (Stroke Patients Only)       Balance Overall balance assessment: Needs assistance;History of Falls Sitting-balance support: Feet supported Sitting balance-Leahy Scale: Fair Sitting balance - Comments: able to sit without physical assist EOB   Standing balance support: During functional activity Standing balance-Leahy Scale: Poor                               Pertinent Vitals/Pain Pain Assessment: Faces Faces Pain Scale: Hurts a little bit Pain Location: back Pain Descriptors / Indicators: Pressure Pain Intervention(s): Repositioned;Monitored during session    Home Living  Family/patient expects to be discharged to:: Skilled nursing facility                      Prior Function Level of Independence: Needs assistance   Gait / Transfers Assistance Needed: ambulated with RW           Hand Dominance   Dominant Hand: Right    Extremity/Trunk Assessment   Upper Extremity Assessment: Generalized weakness           Lower Extremity Assessment: Generalized weakness         Communication   Communication: HOH  Cognition Arousal/Alertness: Awake/alert Behavior During Therapy: Flat affect Overall Cognitive Status: History of cognitive impairments - at baseline (able to follow commands)                      General Comments      Exercises        Assessment/Plan    PT Assessment All further PT needs can be met in the next venue of care  PT Diagnosis Difficulty walking;Generalized weakness   PT Problem List Decreased strength;Decreased activity tolerance;Decreased balance;Decreased mobility  PT Treatment Interventions     PT Goals (Current goals can be found in the Care Plan section) Acute Rehab PT Goals Patient Stated Goal: none stated PT Goal Formulation: With patient    Frequency     Barriers to discharge  Co-evaluation               End of Session Equipment Utilized During Treatment: Gait belt Activity Tolerance: No increased pain Patient left: in bed;with call bell/phone within reach;with bed alarm set;with family/visitor present Nurse Communication: Mobility status         Time: 5208-0223 PT Time Calculation (min) (ACUTE ONLY): 16 min   Charges:   PT Evaluation $Initial PT Evaluation Tier I: 1 Procedure     PT G CodesDuncan Dull 04/06/15, [place], PT DPT  [phone]

## 2015-03-09 NOTE — Clinical Social Work Placement (Signed)
   CLINICAL SOCIAL WORK PLACEMENT  NOTE  Date:  03/09/2015  Patient Details  Name: Karla Phillips MRN: [id number] Date of Birth: 1922/01/13  Clinical Social Work is seeking post-discharge placement for this patient at the Skilled  Nursing Facility level of care (*CSW will initial, date and re-position this form in  chart as items are completed):  Yes   Patient/family provided with [place] Clinical Social Work Department's list of facilities offering this level of care within the geographic area requested by the patient (or if unable, by the patient's family).  Yes   Patient/family informed of their freedom to choose among providers that offer the needed level of care, that participate in Medicare, Medicaid or managed care program needed by the patient, have an available bed and are willing to accept the patient.  Yes   Patient/family informed of [place]'s ownership interest [facility] and [facility], as well as of the fact that they are under no obligation to receive care at these facilities.  PASRR submitted to EDS on 03/09/15     PASRR number received on 03/09/15     Existing PASRR number confirmed on       FL2 transmitted to all facilities in geographic area requested by pt/family on 03/09/15     FL2 transmitted to all facilities within larger geographic area on       Patient informed that his/her managed care company has contracts with or will negotiate with certain facilities, including the following:   (YES)         Patient/family informed of bed offers received.  Patient chooses bed at  [facility] )     Physician recommends and patient chooses bed at      Patient to be transferred to  [facility] ) on 03/09/15.  Patient to be transferred to facility by  Sharin Mons([facility] )     Patient family notified on 03/09/15 of transfer.  Name of family member notified:   (Pt's dtr, [name])     PHYSICIAN Please sign DNR      Additional Comment:    _______________________________________________ Derenda FennelBashira Salathiel Ferrara, MSW, LCSWA [phone] 03/09/2015 10:38 AM

## 2015-03-09 NOTE — Progress Notes (Signed)
   [facility] PROGRESS NOTE  Allison QuarryEtheline Bramblett ZOX:[id number] DOB: 1922-04-26 DOA: 03/06/2015  PCP: Georgann HousekeeperHUSAIN,KARRAR, MD  Brief HPI: 81 year old Caucasian female with has medical history of Parkinson's with multiple recent falls, presented with another fall. She was found to have intracranial hemorrhage. She was hospitalized for further management.  Past medical history:  Past Medical History  Diagnosis Date  . Hypertension   . Osteopenia   . GERD (gastroesophageal reflux disease)   . Memory loss   . Tremor   . Parkinson disease     Consultants: Discussed with neurosurgery and ENT at the time of admission  Procedures: None  Antibiotics: None  Subjective: Patient denies any complaints. Her son is at the bedside. She's somewhat lethargic. Easily arousable.  Objective: Vital Signs  Filed Vitals:   03/07/15 0534 03/07/15 0949 03/07/15 2000 03/09/15 0600  BP: 99/63 207/65  135/66  Pulse: 80   56  Temp: 99.5 F (37.5 C) 99.3 F (37.4 C)  98.3 F (36.8 C)  TempSrc: Axillary Axillary  Oral  Resp: 20 20 18 18   SpO2: 100% 100%  96%   No intake or output data in the 24 hours ending 03/09/15 0838 There were no vitals filed for this visit.  General appearance: Slightly lethargic, although easily arousable. In no distress. Resp: Diminished air entry at the bases. No crackles or wheezing. Cardio: regular rate and rhythm, S1, S2 normal, no murmur, click, rub or gallop GI: soft, non-tender; bowel sounds normal; no masses,  no organomegaly Extremities: extremities normal, atraumatic, no cyanosis or edema   Lab Results:  Basic Metabolic Panel:  Recent Labs Lab 03/06/15 2103  NA 141  K 3.5  CL 103  CO2 29  GLUCOSE 146*  BUN 26*  CREATININE 0.75  CALCIUM 9.6   CBC:  Recent Labs Lab 03/06/15 2103  WBC 6.9  HGB 12.4  HCT 38.7  MCV 93.5  PLT 179   Medications:  Scheduled: . carbidopa-levodopa  1 tablet Oral TID WC   Continuous:  WJX:BJYNWGNFAOZHYPRN:acetaminophen  **OR** acetaminophen, fentaNYL (SUBLIMAZE) injection, LORazepam **OR** [DISCONTINUED] LORazepam **OR** LORazepam, morphine CONCENTRATE, ondansetron **OR** ondansetron (ZOFRAN) IV  Assessment/Plan:  Principal Problem:   Intracerebral hemorrhage, intraventricular Active Problems:   Parkinson disease   Intracranial hemorrhage following injury   Closed fracture of facial bones   Accelerated hypertension   Intraventricular hemorrhage   Right maxillary fracture   Palliative care encounter    Patient was found to have evidence for intracranial hemorrhage following a fall. Neurosurgery and ENT was consulted and they did not feel that the patient will benefit from any aggressive intervention, especially considering her overall poor functional status. Patient was subsequently seen by palliative medicine. Plan is for transition to comfort care eventually. She will be discharged to skilled nursing facility today. Her prognosis is poor. All of the above discussed with the son. Await placement.  DVT Prophylaxis: None, as she is being transitioned to comfort care    Code Status: DO NOT RESUSCITATE  Family Communication: Discussed with her son  Disposition Plan: Discharge to skilled nursing facility   LOS: 2 days   [facility] Medical CenterKRISHNAN,Belton Peplinski  [facility] Pager [phone] 03/09/2015, 8:38 AM  If 7PM-7AM, please contact night-coverage at [email or web address], password [email or web address]

## 2015-03-09 NOTE — Clinical Social Work Note (Signed)
[employer] has authorized SNF placement. [facility]'s [facility]. CSW has notified facility and [facility] returned [facility].   CSW remains available.   Derenda FennelBashira Dmetrius Ambs, MSW, LCSWA [phone] 03/09/2015 8:55 AM

## 2015-03-09 NOTE — Clinical Social Work Note (Signed)
Clinical Social Worker facilitated patient discharge including contacting patient family and facility to confirm patient discharge plans.  Clinical information faxed to facility and family agreeable with plan.  CSW arranged ambulance transport via [facility] to [facility].  RN to call report prior to discharge.  DC packet prepared and on chart for transport with number for report.   [employer] Medicare authorization [id number] for 3 days, next review will be July 1st- family informed.   Clinical Social Worker will sign off for now as social work intervention is no longer needed. Please consult us again if new need arises.  Karla Phillips, MSW, LCSWA [phone] 03/09/2015 10:36 AM

## 2015-03-09 NOTE — Progress Notes (Signed)
Pt is being discharged to SNF. Discharge packages were given to transporter.

## 2015-03-10 ENCOUNTER — Non-Acute Institutional Stay (SKILLED_NURSING_FACILITY): Payer: Medicare Other | Admitting: Internal Medicine

## 2015-03-10 DIAGNOSIS — R5381 Other malaise: Secondary | ICD-10-CM

## 2015-03-10 DIAGNOSIS — S06369S Traumatic hemorrhage of cerebrum, unspecified, with loss of consciousness of unspecified duration, sequela: Secondary | ICD-10-CM

## 2015-03-10 DIAGNOSIS — S0240CS Maxillary fracture, right side, sequela: Secondary | ICD-10-CM

## 2015-03-10 DIAGNOSIS — G20 Parkinson's disease: Secondary | ICD-10-CM

## 2015-03-10 DIAGNOSIS — I10 Essential (primary) hypertension: Secondary | ICD-10-CM | POA: Diagnosis not present

## 2015-03-10 DIAGNOSIS — F411 Generalized anxiety disorder: Secondary | ICD-10-CM

## 2015-03-10 DIAGNOSIS — S02401S Maxillary fracture, unspecified, sequela: Secondary | ICD-10-CM

## 2015-03-10 NOTE — Progress Notes (Signed)
Patient ID: Karla Phillips, female   DOB: 02/10/1922, 81 y.o.   MRN: [id number]     Facility: [facility]    PCP: Georgann HousekeeperHUSAIN,KARRAR, MD  Code Status: DNR  No Known Allergies  Chief Complaint  Patient presents with  . New Admit To SNF     HPI:  81 year old patient is here for short term rehabilitation post hospital admission from 03/06/15-03/08/15 with a fall. She had intracerebral hemorrhage, closed fracture of facial bone including right maxillary fracture. ENT and neurosurgery were consulted and No surgical intervention was recommended. Palliative care was consulted and comfort care is now the goal of care. She is seen in her room today. She is in no distress and denies any concerns. She is alert and oriented to person. She feels weak and tired. She has PMH of HTN, parkinson's disease.   Review of Systems:  Constitutional: Negative for fever, chills, diaphoresis.  HENT: Negative for headache, congestion, nasal discharge Eyes: Negative for eye pain, blurred vision, double vision and discharge.  Respiratory: Negative for cough, shortness of breath and wheezing.   Cardiovascular: Negative for chest pain, palpitations, leg swelling.  Gastrointestinal: Negative for heartburn, vomiting, abdominal pain. Has occasional nausea Genitourinary: Negative for dysuria Musculoskeletal: Negative for back pain, falls in facility Skin: Negative for itching, rash.  Neurological: Negative for tingling, focal weakness Psychiatric/Behavioral: Negative for depression    Past Medical History  Diagnosis Date  . Hypertension   . Osteopenia   . GERD (gastroesophageal reflux disease)   . Memory loss   . Tremor   . Parkinson disease    Past Surgical History  Procedure Laterality Date  . Eye surgery    . Breast surgery    . Tonsillectomy     Social History:   reports that she has never smoked. She does not have any smokeless tobacco history on file. She reports that she  does not drink alcohol. Her drug history is not on file.  No family history on file.  Medications: Patient's Medications  New Prescriptions   No medications on file  Previous Medications   CARBIDOPA-LEVODOPA (SINEMET IR) 25-100 MG PER TABLET    Take 1 tablet by mouth 3 (three) times daily. Give 30 minutes before meals.   LORAZEPAM (ATIVAN) 0.5 MG TABLET    Take 0.5 mg by mouth every 4 (four) hours as needed for anxiety.    LORAZEPAM (ATIVAN) 2 MG/ML CONCENTRATED SOLUTION    Take 0.5 mLs (1 mg total) by mouth every 4 (four) hours as needed for anxiety.   MORPHINE SULFATE (MORPHINE CONCENTRATE) 10 MG / 0.5 ML CONCENTRATED SOLUTION    Take 0.25-0.5 mLs (5-10 mg total) by mouth every 2 (two) hours as needed for severe pain.  Modified Medications   No medications on file  Discontinued Medications   No medications on file     Physical Exam: Filed Vitals:   03/10/15 1604  BP: 125/79  Pulse: 87  Temp: 98.7 F (37.1 C)  Resp: 18  Height: 5\' 5"  (1.651 m)  Weight: 115 lb 8 oz (52.39 kg)  SpO2: 93%    General- elderly female, thin built, frail and in no acute distress Head- normocephalic, bruises on the face mainly on right maxillary and peri orbital area Throat- moist mucus membrane Eyes- no pallor, no icterus, no discharge, normal conjunctiva, normal sclera Neck- no cervical lymphadenopathy Cardiovascular- normal s1,s2, no murmurs, palpable radial pulses, no leg edema Respiratory- bilateral clear to auscultation, no wheeze, no  rhonchi, no crackles, no use of accessory muscles Abdomen- bowel sounds present, soft, non tender Musculoskeletal- able to move all 4 extremities, generalized weakness Neurological- alert and oriented to person, right hand pill rolling tremor noted Skin- warm and dry, bruises noted Psychiatry- normal mood and affect    Labs reviewed: Basic Metabolic Panel:  Recent Labs  40/98/11 2055 03/06/15 2103  NA 141 141  K 3.8 3.5  CL 100 103  CO2 29 29    GLUCOSE 119* 146*  BUN 31* 26*  CREATININE 0.69 0.75  CALCIUM 10.0 9.6    CBC:  Recent Labs  03/12/14 2055 03/06/15 2103  WBC 6.5 6.9  HGB 12.0 12.4  HCT 37.5 38.7  MCV 95.4 93.5  PLT 165 179    Radiological Exams:  03/06/15 ct head, cervical spine and sinus IMPRESSION: 1. Very small volume intracranial hemorrhage collected dependently in the lateral ventricular occipital horns 2. Fractures of the right lateral orbit, right zygomatic arch, anterior and posterior right maxillary sinus walls. There is caudal extension from the right maxillary sinus fracture into the right alveolar ridge, nondisplaced. The orbital floor is intact. Pterygoid plates are intact. 3. Negative for acute cervical spine fracture.     Assessment/Plan  Physical deconditioning Will have patient work with PT/OT as tolerated to regain strength and restore function.  Fall precautions are in place. With her progressive parkinson's disease prognosis is poor, under palliative care in hospital. Get palliative care consult  Intracerebral hemorrhage Neurologically stable, monitor clinically, palliative care consult pending  Right maxillary bone fracture Continue roxanol 5-10 mg q2h prn pain and monitor  Parkinson disease With pill rolling tremor and unsteady gait, high fall risk, continue sinemet 25-100 tid  HTN Stable bp reading, off all medications, monitor clinically  Anxiety Continue ativan prn current regimen   Goals of care: short term rehabilitation, possibel long term care   Labs/tests ordered: none  Family/ staff Communication: reviewed care plan with patient and nursing supervisor    Oneal Grout, MD  [facility] [phone] (Monday-Friday 8 am - 5 pm) [phone] (afterhours)

## 2015-03-17 ENCOUNTER — Non-Acute Institutional Stay (SKILLED_NURSING_FACILITY): Payer: Medicare Other | Admitting: Internal Medicine

## 2015-03-17 DIAGNOSIS — G20 Parkinson's disease: Secondary | ICD-10-CM | POA: Diagnosis not present

## 2015-03-17 DIAGNOSIS — S02401S Maxillary fracture, unspecified, sequela: Secondary | ICD-10-CM | POA: Diagnosis not present

## 2015-03-17 DIAGNOSIS — Z515 Encounter for palliative care: Secondary | ICD-10-CM

## 2015-03-17 DIAGNOSIS — S06369S Traumatic hemorrhage of cerebrum, unspecified, with loss of consciousness of unspecified duration, sequela: Secondary | ICD-10-CM

## 2015-03-17 DIAGNOSIS — S0240CS Maxillary fracture, right side, sequela: Secondary | ICD-10-CM

## 2015-03-18 NOTE — Progress Notes (Signed)
Patient ID: Karla Phillips, female   DOB: [birth year], 81 y.o.   MRN: [id number]     Facility: [facility]    PCP: Georgann HousekeeperHUSAIN,KARRAR, MD  Code Status: DNR  No Known Allergies  Chief Complaint  Patient presents with  . Discharge Note     HPI:  81 year old patient is seen for discharge visit. She will be going home with hospice services. She was here for short term rehabilitation post hospital admission from 03/06/15-03/08/15 post fall with intracerebral hemorrhage, closed fracture of facial bone including right maxillary fracture. She is seen in her room today. She is in no distress and denies any concerns. She is alert and oriented to person.   Review of Systems:  Constitutional: Negative for fever, chills Respiratory: Negative for cough, shortness of breath and wheezing.   Cardiovascular: Negative for chest pain, palpitations, leg swelling.  Gastrointestinal: Negative for heartburn, vomiting, abdominal pain. Genitourinary: Negative for dysuria Skin: Negative for itching, rash.    Past Medical History  Diagnosis Date  . Hypertension   . Osteopenia   . GERD (gastroesophageal reflux disease)   . Memory loss   . Tremor   . Parkinson disease    Past Surgical History  Procedure Laterality Date  . Eye surgery    . Breast surgery    . Tonsillectomy     Social History:   reports that she has never smoked. She does not have any smokeless tobacco history on file. She reports that she does not drink alcohol. Her drug history is not on file.  No family history on file.  Medications: Patient's Medications  New Prescriptions   No medications on file  Previous Medications   CARBIDOPA-LEVODOPA (SINEMET IR) 25-100 MG PER TABLET    Take 1 tablet by mouth 3 (three) times daily. Give 30 minutes before meals.   LORAZEPAM (ATIVAN) 0.5 MG TABLET    Take 0.5 mg by mouth every 4 (four) hours as needed for anxiety.    LORAZEPAM (ATIVAN) 2 MG/ML CONCENTRATED  SOLUTION    Take 0.5 mLs (1 mg total) by mouth every 4 (four) hours as needed for anxiety.   MORPHINE SULFATE (MORPHINE CONCENTRATE) 10 MG / 0.5 ML CONCENTRATED SOLUTION    Take 0.25-0.5 mLs (5-10 mg total) by mouth every 2 (two) hours as needed for severe pain.  Modified Medications   No medications on file  Discontinued Medications   No medications on file     Physical Exam: Filed Vitals:   03/17/15 1650  BP: 118/74  Pulse: 78  Temp: 98 F (36.7 C)  Resp: 18  SpO2: 95%    General- elderly female, thin built, frail and in no acute distress Head- normocephalic, resolving bruises Throat- moist mucus membrane Eyes- no pallor, no icterus, no discharge, normal conjunctiva, normal sclera Neck- no cervical lymphadenopathy Cardiovascular- normal s1,s2, no murmurs, palpable radial pulses, no leg edema Respiratory- bilateral clear to auscultation, no wheeze, no rhonchi, no crackles, no use of accessory muscles Abdomen- bowel sounds present, soft, non tender Musculoskeletal- able to move all 4 extremities, generalized weakness Neurological- alert and oriented to person, right hand pill rolling tremor noted Skin- warm and dry, bruises noted Psychiatry- normal mood and affect    Labs reviewed: Basic Metabolic Panel:  Recent Labs  40/98/1106/26/16 2103  NA 141  K 3.5  CL 103  CO2 29  GLUCOSE 146*  BUN 26*  CREATININE 0.75  CALCIUM 9.6    CBC:  Recent Labs  03/06/15 2103  WBC 6.9  HGB 12.4  HCT 38.7  MCV 93.5  PLT 179    Radiological Exams:  03/06/15 ct head, cervical spine and sinus IMPRESSION: 1. Very small volume intracranial hemorrhage collected dependently in the lateral ventricular occipital horns 2. Fractures of the right lateral orbit, right zygomatic arch, anterior and posterior right maxillary sinus walls. There is caudal extension from the right maxillary sinus fracture into the right alveolar ridge, nondisplaced. The orbital floor is intact.  Pterygoid plates are intact. 3. Negative for acute cervical spine fracture.     Assessment/Plan  81 y/o female patient is stable to be discharged home with home health services. She needs hospital bed and gel overlay mattress. Scripts for her medication with 30 days supply will be provided. Hospice nurse and physician to follow further from here. Face to face encounter form, DME form have been filled out. Continue her roxanol, ativan and sinemet  Family/ staff Communication: reviewed care plan with patient and nursing supervisor    Oneal Grout, MD  [facility] [phone] (Monday-Friday 8 am - 5 pm) [phone] (afterhours)

## 2015-04-11 DEATH — deceased

## 2015-07-25 IMAGING — CT CT HEAD W/O CM
5 of 6 series · 17 of 47 positions shown, 19 images · non-contrast
Comparison: 03/15/2006

CLINICAL DATA: Fall.

EXAM:
CT HEAD WITHOUT CONTRAST
CT CERVICAL SPINE WITHOUT CONTRAST
TECHNIQUE: Multidetector CT imaging of the head and cervical spine was
performed following the standard protocol without intravenous
contrast. Multiplanar CT image reconstructions of the cervical spine
were also generated.

[Series 2: head 5.0 h30s · axial · 0.46mm/px · z∈[-67,-17]mm · 2 of 31 slices shown]
[im 11/31  brain]
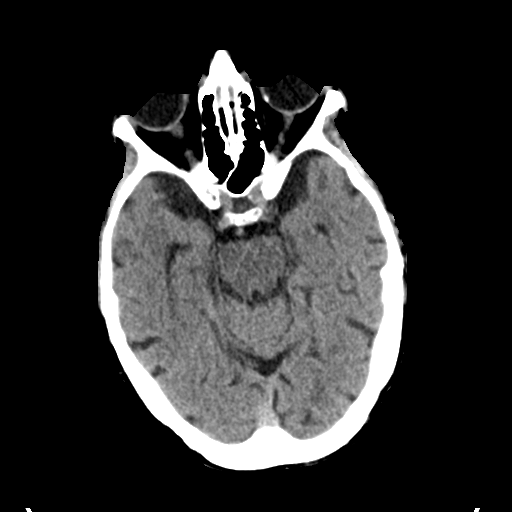
[im 21/31  brain]
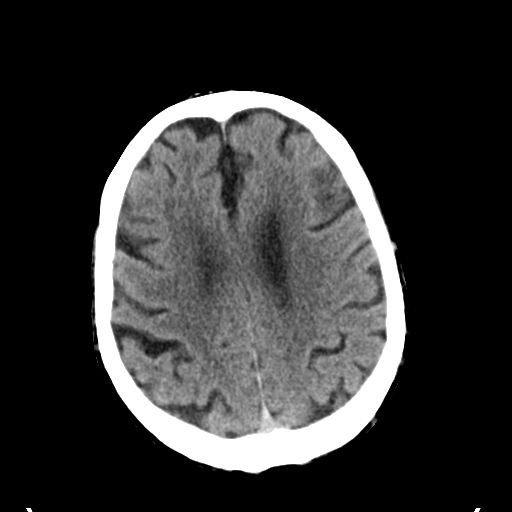

[Series 3: head 2.0 h70h · axial · 0.46mm/px · z∈[-99,+17]mm · 7 of 78 slices shown, 9 images]
[im 10/78  brain]
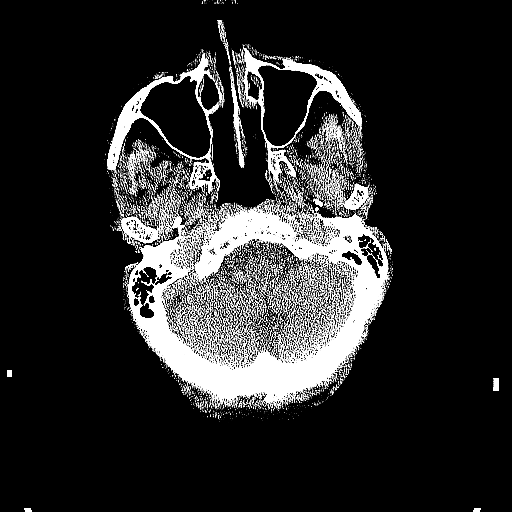
[im 10/78  bone]
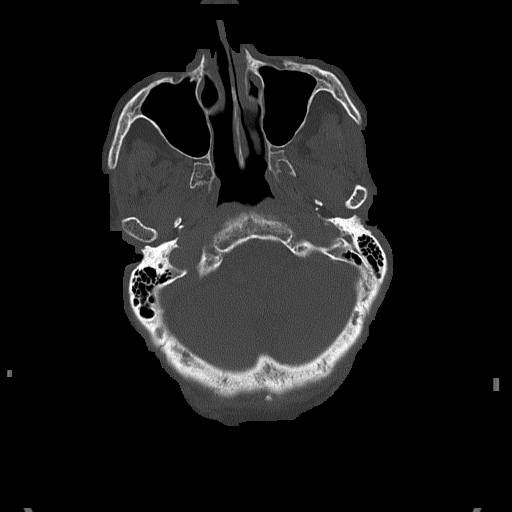
[im 20/78  brain]
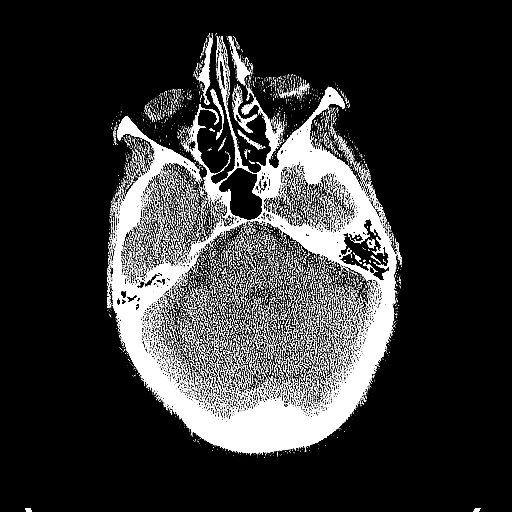
[im 29/78  brain]
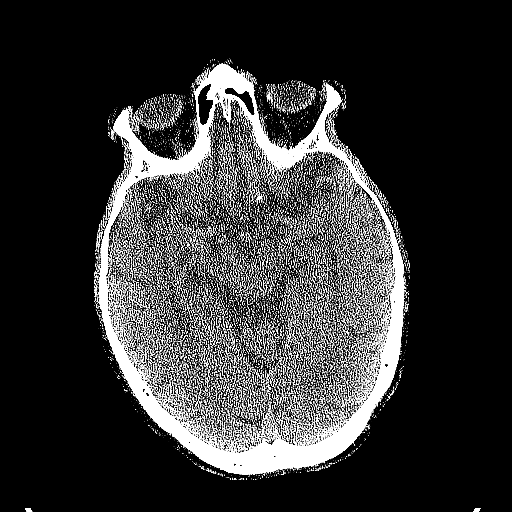
[im 39/78  brain]
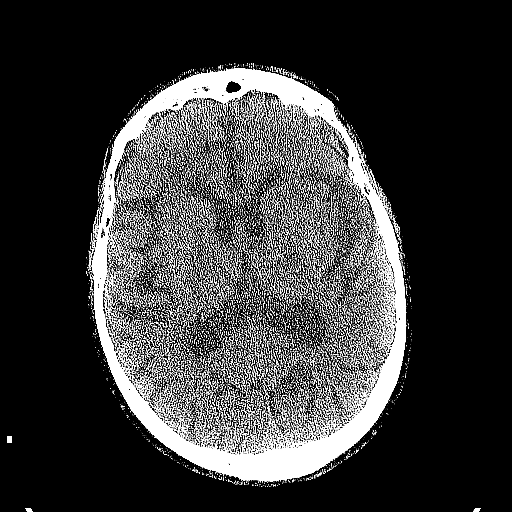
[im 49/78  brain]
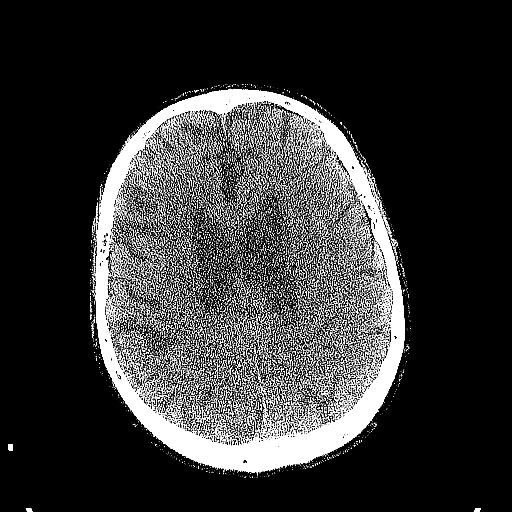
[im 49/78  bone]
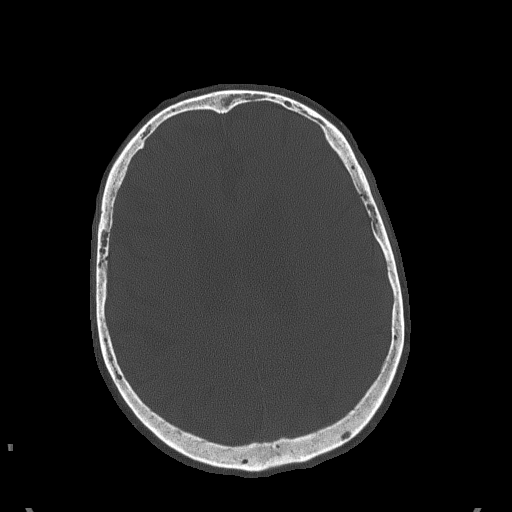
[im 58/78  brain]
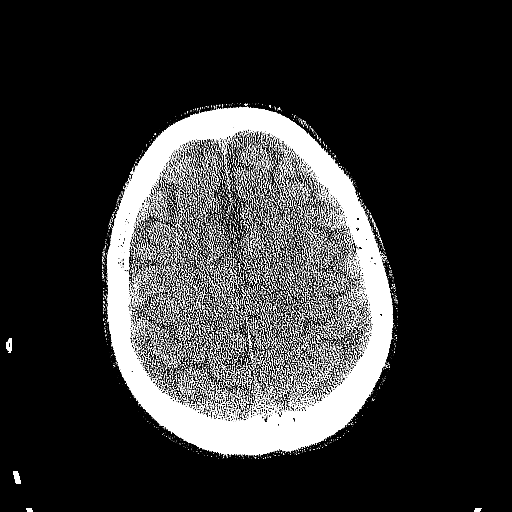
[im 68/78  brain]
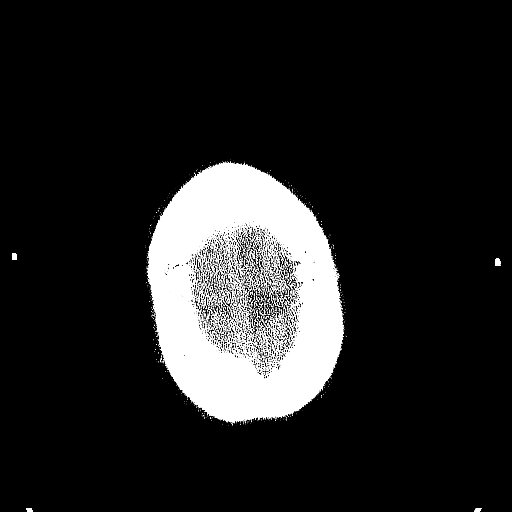

[Series 7: coronals · coronal · 0.32mm/px · 3 of 50 slices shown]
[im 17/50  brain]
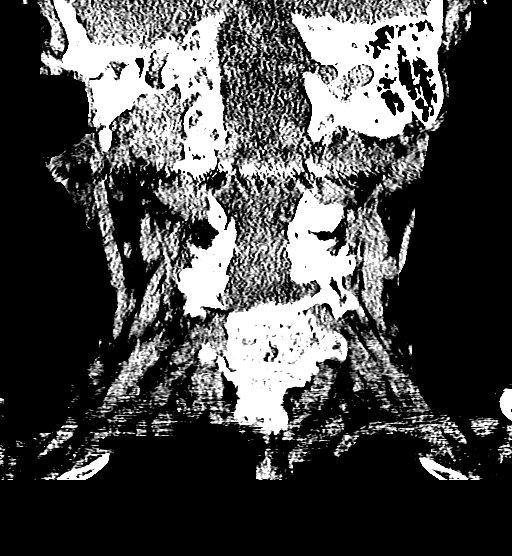
[im 22/50  brain]
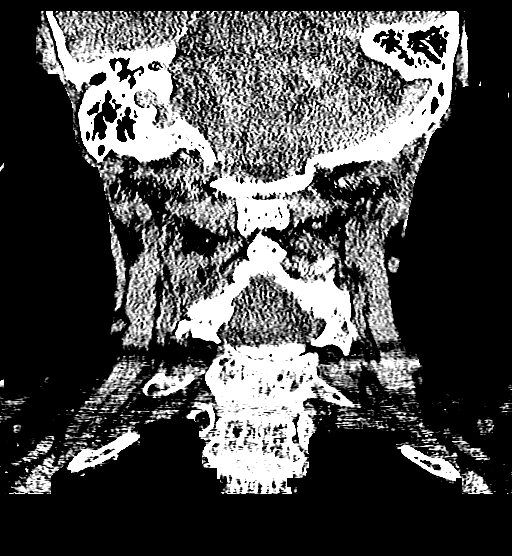
[im 28/50  brain]
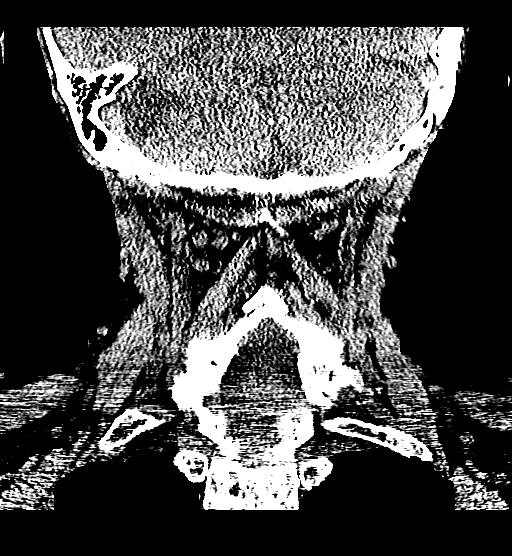

[Series 8: sagittals · sagittal · 0.30mm/px · 3 of 45 slices shown]
[im 15/45  brain]
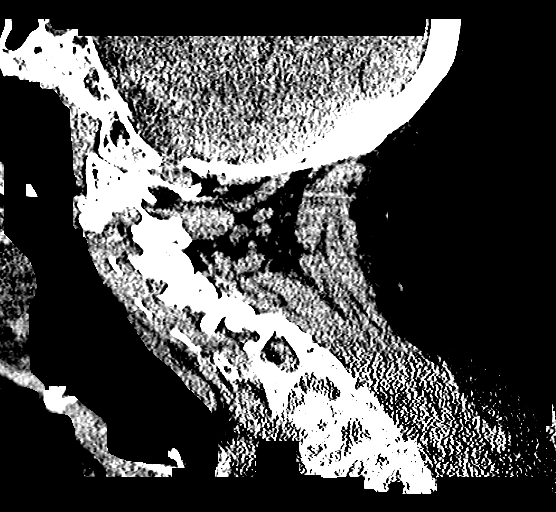
[im 23/45  brain]
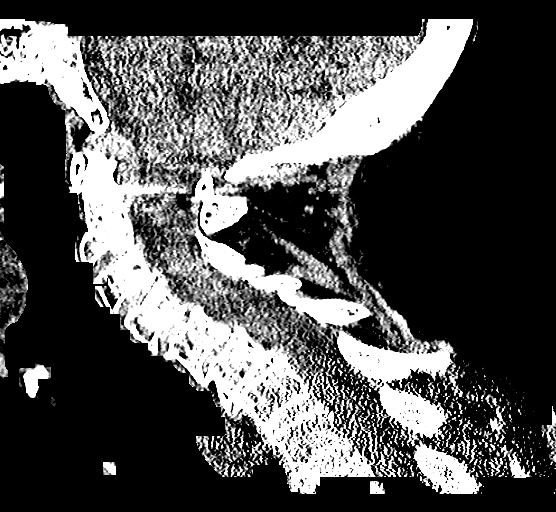
[im 30/45  brain]
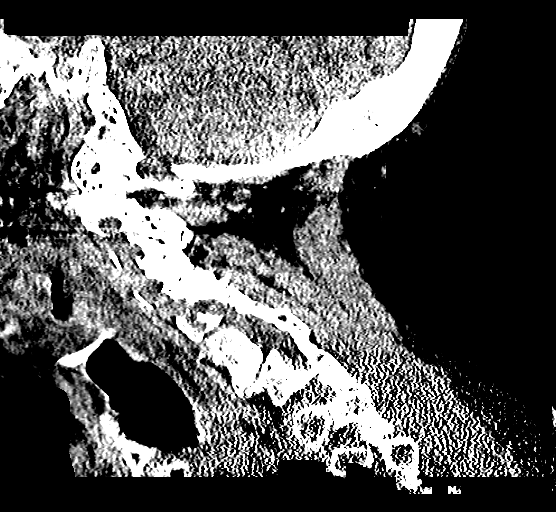

[Series 9: orthogonals · axial · 0.32mm/px · z∈[-247,-232]mm · 2 of 76 slices shown]
[im 10/76  brain]
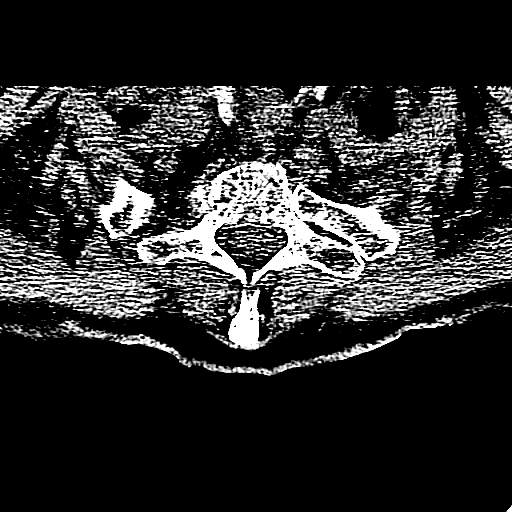
[im 19/76  brain]
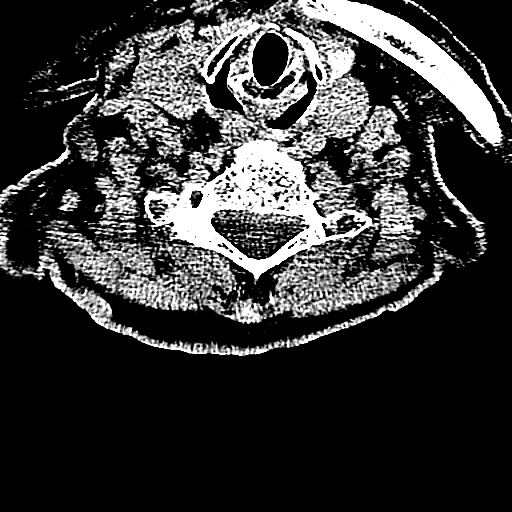

[17 of 47 positions shown; findings below may reference images not displayed]

FINDINGS: CT HEAD FINDINGS

There is atrophy and chronic small vessel disease changes. No acute
intracranial abnormality. Specifically, no hemorrhage,
hydrocephalus, mass lesion, acute infarction, or significant
intracranial injury. No acute calvarial abnormality. Visualized
paranasal sinuses and mastoids clear. Orbital soft tissues
unremarkable.

CT CERVICAL SPINE FINDINGS

Mild degenerative disc disease, most pronounced at C5-6 and C6-7.
Severe diffuse bilateral degenerative facet disease. Slight
anterolisthesis of C4 on C5, C5 on C6 and C6 on C7 related to facet
disease. Prevertebral soft tissues are normal. Diffuse osteopenia.
No fracture. No epidural or paraspinal hematoma.
IMPRESSION: No acute intracranial abnormality.

No acute bony abnormality within the cervical spine.
